# Patient Record
Sex: Male | Born: 1995 | Race: White | Hispanic: No | Marital: Single | Smoking: Never smoker
Health system: Southern US, Community
[De-identification: ages and names within clinical notes are randomized; demographics above are authoritative.]

## PROBLEM LIST (undated history)

## (undated) DIAGNOSIS — E109 Type 1 diabetes mellitus without complications: Secondary | ICD-10-CM

---
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
## 2015-09-25 ENCOUNTER — Emergency Department (HOSPITAL_BASED_OUTPATIENT_CLINIC_OR_DEPARTMENT_OTHER)
Admission: EM | Admit: 2015-09-25 | Discharge: 2015-09-25 | Payer: PRIVATE HEALTH INSURANCE | Attending: Emergency Medicine | Admitting: Emergency Medicine

## 2015-09-25 ENCOUNTER — Encounter (HOSPITAL_BASED_OUTPATIENT_CLINIC_OR_DEPARTMENT_OTHER): Payer: Self-pay | Admitting: *Deleted

## 2015-09-25 ENCOUNTER — Emergency Department (HOSPITAL_BASED_OUTPATIENT_CLINIC_OR_DEPARTMENT_OTHER): Payer: PRIVATE HEALTH INSURANCE

## 2015-09-25 DIAGNOSIS — Z794 Long term (current) use of insulin: Secondary | ICD-10-CM | POA: Diagnosis not present

## 2015-09-25 DIAGNOSIS — R1110 Vomiting, unspecified: Secondary | ICD-10-CM | POA: Diagnosis not present

## 2015-09-25 DIAGNOSIS — R109 Unspecified abdominal pain: Secondary | ICD-10-CM | POA: Diagnosis not present

## 2015-09-25 DIAGNOSIS — E109 Type 1 diabetes mellitus without complications: Secondary | ICD-10-CM | POA: Insufficient documentation

## 2015-09-25 DIAGNOSIS — L03311 Cellulitis of abdominal wall: Secondary | ICD-10-CM | POA: Diagnosis not present

## 2015-09-25 DIAGNOSIS — E1010 Type 1 diabetes mellitus with ketoacidosis without coma: Secondary | ICD-10-CM

## 2015-09-25 DIAGNOSIS — L0390 Cellulitis, unspecified: Secondary | ICD-10-CM

## 2015-09-25 HISTORY — DX: Type 1 diabetes mellitus without complications: E10.9

## 2015-09-25 LAB — COMPREHENSIVE METABOLIC PANEL
ALK PHOS: 185 U/L — AB (ref 38–126)
ALT: 20 U/L (ref 17–63)
AST: 24 U/L (ref 15–41)
Albumin: 5.8 g/dL — ABNORMAL HIGH (ref 3.5–5.0)
Anion gap: 29 — ABNORMAL HIGH (ref 5–15)
BUN: 18 mg/dL (ref 6–20)
CALCIUM: 9.5 mg/dL (ref 8.9–10.3)
CO2: 6 mmol/L — ABNORMAL LOW (ref 22–32)
Chloride: 98 mmol/L — ABNORMAL LOW (ref 101–111)
Creatinine, Ser: 1.48 mg/dL — ABNORMAL HIGH (ref 0.61–1.24)
GFR calc non Af Amer: >60 mL/min (ref >60)
Glucose, Bld: 630 mg/dL (ref 65–99)
Potassium: 6.4 mmol/L (ref 3.5–5.1)
Sodium: 133 mmol/L — ABNORMAL LOW (ref 135–145)
Total Bilirubin: 1.5 mg/dL — ABNORMAL HIGH (ref 0.3–1.2)
Total Protein: 9.6 g/dL — ABNORMAL HIGH (ref 6.5–8.1)

## 2015-09-25 LAB — I-STAT VENOUS BLOOD GAS, ED
ACID-BASE DEFICIT: 28 mmol/L — AB (ref 0.0–2.0)
BICARBONATE: 4.6 meq/L — AB (ref 20.0–24.0)
O2 SAT: 58 %
PH VEN: 6.875 — AB (ref 7.250–7.300)
PO2 VEN: 51 mmHg — AB (ref 30.0–45.0)
TCO2: 5 mmol/L (ref 0–100)
pCO2, Ven: 24.9 mmHg — ABNORMAL LOW (ref 45.0–50.0)

## 2015-09-25 LAB — I-STAT ARTERIAL BLOOD GAS, ED
Acid-base deficit: 26 mmol/L — ABNORMAL HIGH (ref 0.0–2.0)
BICARBONATE: 3.8 meq/L — AB (ref 20.0–24.0)
O2 SAT: 98 %
PCO2 ART: 15.2 mmHg — AB (ref 35.0–45.0)
PO2 ART: 153 mmHg — AB (ref 80.0–100.0)
pH, Arterial: 7.007 — CL (ref 7.350–7.450)

## 2015-09-25 LAB — URINALYSIS, ROUTINE W REFLEX MICROSCOPIC
BILIRUBIN URINE: NEGATIVE
Glucose, UA: >1000 mg/dL — AB
Leukocytes, UA: NEGATIVE
NITRITE: NEGATIVE
PH: 5 (ref 5.0–8.0)
Protein, ur: 100 mg/dL — AB
Specific Gravity, Urine: 1.026 (ref 1.005–1.030)

## 2015-09-25 LAB — CBC WITH DIFFERENTIAL/PLATELET
BAND NEUTROPHILS: 9 %
BASOS ABS: 0.5 10*3/uL — AB (ref 0.0–0.1)
BLASTS: 0 %
Basophils Relative: 1 %
Eosinophils Absolute: 0 10*3/uL (ref 0.0–0.7)
Eosinophils Relative: 0 %
HEMATOCRIT: 50.3 % (ref 39.0–52.0)
HEMOGLOBIN: 18.6 g/dL — AB (ref 13.0–17.0)
Lymphocytes Relative: 9 %
Lymphs Abs: 4.2 10*3/uL — ABNORMAL HIGH (ref 0.7–4.0)
MCH: 31.9 pg (ref 26.0–34.0)
MCHC: 37 g/dL — ABNORMAL HIGH (ref 30.0–36.0)
MCV: 86.3 fL (ref 78.0–100.0)
METAMYELOCYTES PCT: 1 %
Monocytes Absolute: 1.4 10*3/uL — ABNORMAL HIGH (ref 0.1–1.0)
Monocytes Relative: 3 %
Myelocytes: 1 %
Neutro Abs: 40.9 10*3/uL — ABNORMAL HIGH (ref 1.7–7.7)
Neutrophils Relative %: 76 %
Other: 0 %
PROMYELOCYTES ABS: 0 %
Platelets: 488 10*3/uL — ABNORMAL HIGH (ref 150–400)
RBC: 5.83 MIL/uL — AB (ref 4.22–5.81)
RDW: 13 % (ref 11.5–15.5)
WBC: 47 10*3/uL — ABNORMAL HIGH (ref 4.0–10.5)
nRBC: 0 /100 WBC

## 2015-09-25 LAB — CBG MONITORING, ED
GLUCOSE-CAPILLARY: 533 mg/dL — AB (ref 65–99)
GLUCOSE-CAPILLARY: 310 mg/dL — AB (ref 65–99)
GLUCOSE-CAPILLARY: 275 mg/dL — AB (ref 65–99)
Glucose-Capillary: 537 mg/dL — ABNORMAL HIGH (ref 65–99)
Glucose-Capillary: 329 mg/dL — ABNORMAL HIGH (ref 65–99)

## 2015-09-25 LAB — URINE MICROSCOPIC-ADD ON

## 2015-09-25 LAB — LIPASE, BLOOD: LIPASE: 14 U/L (ref 11–51)

## 2015-09-25 LAB — I-STAT CG4 LACTIC ACID, ED: Lactic Acid, Venous: 3.49 mmol/L (ref 0.5–2.0)

## 2015-09-25 MED ORDER — MORPHINE SULFATE (PF) 4 MG/ML IV SOLN
INTRAVENOUS | Status: AC
  Filled 2015-09-25: qty 1

## 2015-09-25 MED ORDER — ONDANSETRON HCL 4 MG/2ML IJ SOLN
4.0000 mg | Freq: Once | INTRAMUSCULAR | Status: AC
  Administered 2015-09-25: 4 mg via INTRAVENOUS
  Filled 2015-09-25: qty 2

## 2015-09-25 MED ORDER — DEXTROSE-NACL 5-0.45 % IV SOLN: INTRAVENOUS | Status: DC

## 2015-09-25 MED ORDER — SODIUM CHLORIDE 0.9 % IV BOLUS (SEPSIS): 1000.0000 mL | Freq: Once | INTRAVENOUS | Status: DC

## 2015-09-25 MED ORDER — CIPROFLOXACIN IN D5W 400 MG/200ML IV SOLN
400.0000 mg | Freq: Once | INTRAVENOUS | Status: AC
  Administered 2015-09-25: 400 mg via INTRAVENOUS
  Filled 2015-09-25: qty 200

## 2015-09-25 MED ORDER — SODIUM CHLORIDE 0.9 % IV SOLN
INTRAVENOUS | Status: DC
  Administered 2015-09-25: 4.7 [IU]/h via INTRAVENOUS
  Filled 2015-09-25 (×3): qty 2.5

## 2015-09-25 MED ORDER — SODIUM BICARBONATE 8.4 % IV SOLN
50.0000 meq | Freq: Once | INTRAVENOUS | Status: AC
  Administered 2015-09-25: 50 meq via INTRAVENOUS
  Filled 2015-09-25: qty 50

## 2015-09-25 MED ORDER — VANCOMYCIN HCL IN DEXTROSE 1-5 GM/200ML-% IV SOLN: 1000.0000 mg | Freq: Once | INTRAVENOUS | Status: DC

## 2015-09-25 MED ORDER — MORPHINE SULFATE (PF) 4 MG/ML IV SOLN
4.0000 mg | Freq: Once | INTRAVENOUS | Status: AC
  Administered 2015-09-25: 4 mg via INTRAVENOUS

## 2015-09-25 MED ORDER — SODIUM CHLORIDE 0.9 % IV BOLUS (SEPSIS)
1000.0000 mL | Freq: Once | INTRAVENOUS | Status: AC
  Administered 2015-09-25: 1000 mL via INTRAVENOUS

## 2015-09-25 MED ORDER — IOHEXOL 300 MG/ML  SOLN
100.0000 mL | Freq: Once | INTRAMUSCULAR | Status: AC | PRN
  Administered 2015-09-25: 100 mL via INTRAVENOUS

## 2015-09-25 MED ORDER — MORPHINE SULFATE (PF) 4 MG/ML IV SOLN
4.0000 mg | Freq: Once | INTRAVENOUS | Status: AC
  Administered 2015-09-25: 4 mg via INTRAVENOUS
  Filled 2015-09-25: qty 1

## 2015-09-25 MED ORDER — SODIUM CHLORIDE 0.9 % IV BOLUS (SEPSIS)
2000.0000 mL | Freq: Once | INTRAVENOUS | Status: AC
  Administered 2015-09-25: 2000 mL via INTRAVENOUS

## 2015-09-25 MED ORDER — METRONIDAZOLE IN NACL 5-0.79 MG/ML-% IV SOLN
500.0000 mg | Freq: Once | INTRAVENOUS | Status: AC
  Administered 2015-09-25: 500 mg via INTRAVENOUS
  Filled 2015-09-25: qty 100

## 2015-09-25 NOTE — ED Provider Notes (Signed)
CSN: [id number]     Arrival date & time 09/25/15  1013 History   First MD Initiated Contact with Patient 09/25/15 1017     Chief Complaint  Patient presents with  . Emesis     (Consider location/radiation/quality/duration/timing/severity/associated sxs/prior Treatment) The history is provided by the patient.  Andre Stanton is a 20 y.o. male history of type 1 diabetes here presenting with vomiting, abdominal pain. Patient has numerous episode of vomiting since 11 PM yesterday. Also complaints of diffuse abdominal pain worse in the epigastric area. Denies any fevers. Unable to keep anything down. Wonda Olds is sick with URI symptoms. Patient has hx of type 1 diabetes with DKA previously with similar symptoms. Has been followed with Dr. Yetta Barre from [facility]      Past Medical History  Diagnosis Date  . Diabetes mellitus type 1 (HCC)    History reviewed. No pertinent past surgical history. No family history on file. Social History  Substance Use Topics  . Smoking status: Never Smoker   . Smokeless tobacco: Never Used  . Alcohol Use: No    Review of Systems  Gastrointestinal: Positive for vomiting and abdominal pain.  All other systems reviewed and are negative.     Allergies  Review of patient's allergies indicates no known allergies.  Home Medications   Prior to Admission medications   Medication Sig Start Date End Date Taking? Authorizing Provider  insulin lispro (HUMALOG) 100 UNIT/ML injection Inject into the skin 3 (three) times daily before meals.   Yes Historical Provider, MD   BP 146/95 mmHg  Pulse 116  Temp(Src) 98.2 F (36.8 C) (Oral)  Resp 36  SpO2 100% Physical Exam  Constitutional: He is oriented to person, place, and time.  Uncomfortable, mild distress.   HENT:  Head: Normocephalic.  MM dry   Eyes: Conjunctivae are normal. Pupils are equal, round, and reactive to light.  Neck: Normal range of motion. Neck supple.  Cardiovascular: Regular rhythm and normal  heart sounds.   Tachycardic   Pulmonary/Chest: Effort normal and breath sounds normal.  tachypneic   Abdominal: Soft. Bowel sounds are normal.  Mild diffuse tenderness, worse in epigastrium.   Musculoskeletal: Normal range of motion. He exhibits no edema or tenderness.  Neurological: He is alert and oriented to person, place, and time. No cranial nerve deficit. Coordination normal.  Skin: Skin is warm and dry.  Psychiatric: He has a normal mood and affect. His behavior is normal. Judgment and thought content normal.  Nursing note and vitals reviewed.   ED Course  Procedures (including critical care time)  CRITICAL CARE Performed by: Silverio Lay, Mattie Nordell   Total critical care time: 30 minutes  Critical care time was exclusive of separately billable procedures and treating other patients.  Critical care was necessary to treat or prevent imminent or life-threatening deterioration.  Critical care was time spent personally by me on the following activities: development of treatment plan with patient and/or surrogate as well as nursing, discussions with consultants, evaluation of patient's response to treatment, examination of patient, obtaining history from patient or surrogate, ordering and performing treatments and interventions, ordering and review of laboratory studies, ordering and review of radiographic studies, pulse oximetry and re-evaluation of patient's condition.   Labs Review Labs Reviewed  CBC WITH DIFFERENTIAL/PLATELET - Abnormal; Notable for the following:    WBC 47.0 (*)    RBC 5.83 (*)    Hemoglobin 18.6 (*)    MCHC 37.0 (*)    Platelets 488 (*)  Neutro Abs 40.9 (*)    Lymphs Abs 4.2 (*)    Monocytes Absolute 1.4 (*)    Basophils Absolute 0.5 (*)    All other components within normal limits  COMPREHENSIVE METABOLIC PANEL - Abnormal; Notable for the following:    Sodium 133 (*)    Potassium 6.4 (*)    Chloride 98 (*)    CO2 6 (*)    Glucose, Bld 630 (*)     Creatinine, Ser 1.48 (*)    Total Protein 9.6 (*)    Albumin 5.8 (*)    Alkaline Phosphatase 185 (*)    Total Bilirubin 1.5 (*)    Anion gap 29 (*)    All other components within normal limits  URINALYSIS, ROUTINE W REFLEX MICROSCOPIC (NOT AT [facility]) - Abnormal; Notable for the following:    Glucose, UA >1000 (*)    Hgb urine dipstick SMALL (*)    Ketones, ur >80 (*)    Protein, ur 100 (*)    All other components within normal limits  URINE MICROSCOPIC-ADD ON - Abnormal; Notable for the following:    Squamous Epithelial / LPF 0-5 (*)    Bacteria, UA RARE (*)    Casts GRANULAR CAST (*)    All other components within normal limits  CBG MONITORING, ED - Abnormal; Notable for the following:    Glucose-Capillary 537 (*)    All other components within normal limits  I-STAT VENOUS BLOOD GAS, ED - Abnormal; Notable for the following:    pH, Ven 6.875 (*)    pCO2, Ven 24.9 (*)    pO2, Ven 51.0 (*)    Bicarbonate 4.6 (*)    Acid-base deficit 28.0 (*)    All other components within normal limits  CBG MONITORING, ED - Abnormal; Notable for the following:    Glucose-Capillary 533 (*)    All other components within normal limits  CBG MONITORING, ED - Abnormal; Notable for the following:    Glucose-Capillary 329 (*)    All other components within normal limits  I-STAT CG4 LACTIC ACID, ED - Abnormal; Notable for the following:    Lactic Acid, Venous 3.49 (*)    All other components within normal limits  I-STAT ARTERIAL BLOOD GAS, ED - Abnormal; Notable for the following:    pH, Arterial 7.007 (*)    pCO2 arterial 15.2 (*)    pO2, Arterial 153.0 (*)    Bicarbonate 3.8 (*)    Acid-base deficit 26.0 (*)    All other components within normal limits  CULTURE, BLOOD (ROUTINE X 2)  CULTURE, BLOOD (ROUTINE X 2)  LIPASE, BLOOD  BLOOD GAS, VENOUS  LACTIC ACID, PLASMA  BLOOD GAS, ARTERIAL    Imaging Review Ct Abdomen Pelvis W Contrast  09/25/2015  CLINICAL DATA:  Abdominal pain and vomiting  since last night EXAM: CT ABDOMEN AND PELVIS WITH CONTRAST TECHNIQUE: Multidetector CT imaging of the abdomen and pelvis was performed using the standard protocol following bolus administration of intravenous contrast. Sagittal and coronal MPR images reconstructed from axial data set. CONTRAST:  OMNIPAQUE IOHEXOL 300 MG/ML SOLN IV. Dilute oral contrast. COMPARISON:  None FINDINGS: Lung bases clear. Liver, gallbladder, spleen, pancreas, kidneys, and adrenal glands normal. Normal appearing retrocecal appendix. Normal appearing bladder and ureters. Stomach and bowel loops grossly unremarkable for technique. LEFT inguinal hernia containing fat. No intra-abdominal mass, adenopathy, free air or free fluid. Focal area of skin thickening and subcutaneous edema at the RIGHT mid abdomen centered at image 48,  could represent trauma or infection. No abdominal wall hernia or acute osseous findings. IMPRESSION: Focal area of skin thickening with subcutaneous edema at the RIGHT mid abdominal wall anteriorly which may represent infection/cellulitis or sequela of prior trauma; recommend correlation with physical exam and history. LEFT inguinal hernia containing fat. No acute intra-abdominal or intrapelvic abnormalities. Electronically Signed   By: Ulyses Southward M.D.   On: 09/25/2015 13:13   Dg Abd Acute W/chest  09/25/2015  CLINICAL DATA:  Abdominal pain and vomiting since last night. EXAM: DG ABDOMEN ACUTE W/ 1V CHEST COMPARISON:  10/14/2009 FINDINGS: Both lungs are clear. Heart and mediastinum are within normal limits. No evidence for free air. Air-fluid level in the stomach. Nonobstructive bowel gas pattern. No large abdominal calcifications. No acute bone abnormality. IMPRESSION: Negative abdominal radiographs.  No acute cardiopulmonary disease. Electronically Signed   By: Richarda Overlie M.D.   On: 09/25/2015 11:08   I have personally reviewed and evaluated these images and lab results as part of my medical  decision-making.   EKG Interpretation None      MDM   Final diagnoses:  None   Andre Stanton is a 20 y.o. male here with vomiting, abdominal pain. Likely DKA. Will need to get acute abdominal series to r/o perforation and get UA and VBG and labs. Will hydrate and start glucostabilizer.   11:40 AM WBC 47. PH 6.8 on VBG. Will get ABG and CT ab/pel and lactate. Code sepsis initiated.   1:36 PM CT showed possible cellulitis. I see some cellulitis from insulin needle injection site on R abdomen. I ordered cipro/flagyl for gastro symptoms but ct showed no colitis or enteritis. Will add vanc for MRSA. ABG showed pH 7.0, PCO2 15. Bicarb was 6 on chemistry. AG 28. K elevated but likely from acidosis so I didn't give him anything. Given 2 L NS and on third liter. Started on [employer]. Consulted MICU, Dr. Mady Haagensen at [facility]. He is breathing more comfortably now. Appears more hydrated now. Will not need intubation but needs ICU care. Will transfer when there is a bed there.     Richardean Canal, MD 09/25/15 [phone]

## 2015-09-25 NOTE — ED Notes (Signed)
[place] [facility] transport team at bedside for transport. 

## 2015-09-25 NOTE — ED Notes (Signed)
MD made aware of CBG value & at bedside.

## 2015-09-25 NOTE — ED Notes (Signed)
Vomiting since 11pm- multiole episodes. Pt is a type 1 diabetic. Has not taken insulin today. CBG 473 pta

## 2015-09-25 NOTE — ED Notes (Signed)
Patient is being transferred to Andre Stanton by [facility] to

## 2015-09-25 NOTE — ED Notes (Signed)
Report called to Brooke, RN

## 2015-09-30 LAB — CULTURE, BLOOD (ROUTINE X 2)
CULTURE: NO GROWTH
Culture: NO GROWTH

## 2016-05-21 ENCOUNTER — Encounter (HOSPITAL_BASED_OUTPATIENT_CLINIC_OR_DEPARTMENT_OTHER): Payer: Self-pay | Admitting: *Deleted

## 2016-05-21 ENCOUNTER — Emergency Department (HOSPITAL_BASED_OUTPATIENT_CLINIC_OR_DEPARTMENT_OTHER)
Admission: EM | Admit: 2016-05-21 | Discharge: 2016-05-22 | Disposition: A | Discharge status: Other Acute Inpatient Hospital | Payer: PRIVATE HEALTH INSURANCE | Attending: Emergency Medicine | Admitting: Emergency Medicine

## 2016-05-21 DIAGNOSIS — E1010 Type 1 diabetes mellitus with ketoacidosis without coma: Secondary | ICD-10-CM | POA: Diagnosis not present

## 2016-05-21 DIAGNOSIS — Z794 Long term (current) use of insulin: Secondary | ICD-10-CM | POA: Diagnosis not present

## 2016-05-21 DIAGNOSIS — R1110 Vomiting, unspecified: Secondary | ICD-10-CM | POA: Diagnosis present

## 2016-05-21 LAB — CBC
HEMATOCRIT: 48.4 % (ref 39.0–52.0)
HEMOGLOBIN: 17.8 g/dL — AB (ref 13.0–17.0)
MCH: 32 pg (ref 26.0–34.0)
MCHC: 36.8 g/dL — AB (ref 30.0–36.0)
MCV: 86.9 fL (ref 78.0–100.0)
Platelets: 320 10*3/uL (ref 150–400)
RBC: 5.57 MIL/uL (ref 4.22–5.81)
RDW: 12.6 % (ref 11.5–15.5)
WBC: 19.6 10*3/uL — ABNORMAL HIGH (ref 4.0–10.5)

## 2016-05-21 LAB — I-STAT VENOUS BLOOD GAS, ED
Acid-base deficit: 20 mmol/L — ABNORMAL HIGH (ref 0.0–2.0)
Bicarbonate: 8.1 mmol/L — ABNORMAL LOW (ref 20.0–28.0)
O2 SAT: 90 %
PCO2 VEN: 26.4 mmHg — AB (ref 44.0–60.0)
PH VEN: 7.091 — AB (ref 7.250–7.430)
PO2 VEN: 76 mmHg — AB (ref 32.0–45.0)
Patient temperature: 97.9
TCO2: 9 mmol/L (ref 0–100)

## 2016-05-21 LAB — BASIC METABOLIC PANEL
ANION GAP: 24 — AB (ref 5–15)
BUN: 20 mg/dL (ref 6–20)
CALCIUM: 9.7 mg/dL (ref 8.9–10.3)
CO2: 8 mmol/L — AB (ref 22–32)
Chloride: 101 mmol/L (ref 101–111)
Creatinine, Ser: 1.14 mg/dL (ref 0.61–1.24)
GFR calc Af Amer: >60 mL/min (ref >60)
GFR calc non Af Amer: >60 mL/min (ref >60)
GLUCOSE: 536 mg/dL — AB (ref 65–99)
Potassium: 5.2 mmol/L — ABNORMAL HIGH (ref 3.5–5.1)
Sodium: 133 mmol/L — ABNORMAL LOW (ref 135–145)

## 2016-05-21 LAB — CBG MONITORING, ED: Glucose-Capillary: 501 mg/dL (ref 65–99)

## 2016-05-21 MED ORDER — INSULIN REGULAR HUMAN 100 UNIT/ML IJ SOLN
INTRAMUSCULAR | Status: AC
  Filled 2016-05-21: qty 1

## 2016-05-21 MED ORDER — DEXTROSE-NACL 5-0.45 % IV SOLN: INTRAVENOUS | Status: DC

## 2016-05-21 MED ORDER — SODIUM CHLORIDE 0.9 % IV SOLN: INTRAVENOUS | Status: DC

## 2016-05-21 MED ORDER — SODIUM CHLORIDE 0.9 % IV BOLUS (SEPSIS)
1000.0000 mL | Freq: Once | INTRAVENOUS | Status: AC
  Administered 2016-05-22: 1000 mL via INTRAVENOUS

## 2016-05-21 MED ORDER — SODIUM CHLORIDE 0.9 % IV BOLUS (SEPSIS)
1000.0000 mL | Freq: Once | INTRAVENOUS | Status: AC
  Administered 2016-05-21: 1000 mL via INTRAVENOUS

## 2016-05-21 MED ORDER — SODIUM CHLORIDE 0.9 % IV SOLN
INTRAVENOUS | Status: DC
  Administered 2016-05-21: 4.8 [IU]/h via INTRAVENOUS
  Filled 2016-05-21: qty 2.5

## 2016-05-21 MED ORDER — ONDANSETRON HCL 4 MG/2ML IJ SOLN
4.0000 mg | Freq: Once | INTRAMUSCULAR | Status: AC
  Administered 2016-05-21: 4 mg via INTRAVENOUS
  Filled 2016-05-21: qty 2

## 2016-05-21 NOTE — ED Provider Notes (Signed)
[facility] Provider Note   CSN: [id number] Arrival date & time: 05/21/16  2202     History   Chief Complaint Chief Complaint  Patient presents with  . Emesis    HPI Andre Stanton is a 20 y.o. male.  HPI Patient has history of type 1 diabetes. Presents tonight with multiple episodes of vomiting starting around 6 PM. Complains of pain "all over". Denies any recent fever or chills. Has had similar presentations with DKA. States he believes this was going on this time. Denies non-compliance with his insulin regimen. Past Medical History:  Diagnosis Date  . Diabetes mellitus type 1 (HCC)     There are no active problems to display for this patient.   History reviewed. No pertinent surgical history.     Home Medications    Prior to Admission medications   Medication Sig Start Date End Date Taking? Authorizing Provider  Insulin Glargine (TOUJEO SOLOSTAR [place]) Inject into the skin.   Yes Historical Provider, MD  insulin lispro (HUMALOG) 100 UNIT/ML injection Inject into the skin 3 (three) times daily before meals.    Historical Provider, MD    Family History No family history on file.  Social History Social History  Substance Use Topics  . Smoking status: Never Smoker  . Smokeless tobacco: Never Used  . Alcohol use No     Allergies   Review of patient's allergies indicates no known allergies.   Review of Systems Review of Systems  Constitutional: Positive for fatigue. Negative for chills and fever.  Respiratory: Negative for cough and shortness of breath.   Cardiovascular: Negative for chest pain.  Gastrointestinal: Positive for abdominal pain, nausea and vomiting. Negative for constipation and diarrhea.  Genitourinary: Negative for dysuria and flank pain.  Musculoskeletal: Positive for myalgias. Negative for joint swelling, neck pain and neck stiffness.  Skin: Negative for rash and wound.  Neurological: Positive for weakness (generalized) and  headaches. Negative for dizziness, light-headedness and numbness.  All other systems reviewed and are negative.    Physical Exam Updated Vital Signs BP 124/74   Pulse 108   Temp 97.9 F (36.6 C) (Oral)   Resp 18   Ht 5\' 4"  (1.626 m)   Wt 120 lb (54.4 kg)   SpO2 100%   BMI 20.60 kg/m   Physical Exam  Constitutional: He is oriented to person, place, and time. He appears well-developed and well-nourished. No distress.  HENT:  Head: Normocephalic and atraumatic.  Mouth/Throat: Oropharynx is clear and moist.  Eyes: EOM are normal. Pupils are equal, round, and reactive to light.  Neck: Normal range of motion. Neck supple.  Cardiovascular: Regular rhythm.  Exam reveals no friction rub.   No murmur heard. Tachycardia  Pulmonary/Chest: Effort normal and breath sounds normal. No respiratory distress. He has no wheezes. He has no rales. He exhibits no tenderness.  Tachypnea  Abdominal: Soft. Bowel sounds are normal. There is tenderness (mild diffuse abdominal tenderness without focality.). There is no rebound and no guarding.  Musculoskeletal: Normal range of motion. He exhibits no edema or tenderness.  No lower extremity swelling or tenderness. Distal pulses intact.  Neurological: He is alert and oriented to person, place, and time.  Moves all extremities without deficit. Sensation is fully intact.  Skin: Skin is warm and dry. Capillary refill takes less than 2 seconds. No rash noted. No erythema.  Psychiatric: He has a normal mood and affect. His behavior is normal.  Nursing note and vitals reviewed.  ED Treatments / Results  Labs (all labs ordered are listed, but only abnormal results are displayed) Labs Reviewed  BASIC METABOLIC PANEL - Abnormal; Notable for the following:       Result Value   Sodium 133 (*)    Potassium 5.2 (*)    CO2 8 (*)    Glucose, Bld 536 (*)    Anion gap 24 (*)    All other components within normal limits  CBC - Abnormal; Notable for the  following:    WBC 19.6 (*)    Hemoglobin 17.8 (*)    MCHC 36.8 (*)    All other components within normal limits  CBG MONITORING, ED - Abnormal; Notable for the following:    Glucose-Capillary 501 (*)    All other components within normal limits  I-STAT VENOUS BLOOD GAS, ED - Abnormal; Notable for the following:    pH, Ven 7.091 (*)    pCO2, Ven 26.4 (*)    pO2, Ven 76.0 (*)    Bicarbonate 8.1 (*)    Acid-base deficit 20.0 (*)    All other components within normal limits  URINALYSIS, ROUTINE W REFLEX MICROSCOPIC (NOT AT [facility])  CBG MONITORING, ED    EKG  EKG Interpretation None       Radiology No results found.  Procedures Procedures (including critical care time)  Medications Ordered in ED Medications  insulin regular (NOVOLIN R,HUMULIN R) 250 Units in sodium chloride 0.9 % 250 mL (1 Units/mL) infusion (4.8 Units/hr Intravenous New Bag/Given 05/21/16 2317)  sodium chloride 0.9 % bolus 1,000 mL (not administered)    And  sodium chloride 0.9 % bolus 1,000 mL (1,000 mLs Intravenous New Bag/Given 05/21/16 2318)    And  sodium chloride 0.9 % bolus 1,000 mL (0 mLs Intravenous Stopped 05/21/16 2318)    And  0.9 %  sodium chloride infusion (not administered)  dextrose 5 %-0.45 % sodium chloride infusion (not administered)  insulin regular (NOVOLIN R,HUMULIN R) 100 units/mL injection (  Not Given 05/21/16 2323)  ondansetron (ZOFRAN) injection 4 mg (4 mg Intravenous Given 05/21/16 2235)     Initial Impression / Assessment and Plan / ED Course  I have reviewed the triage vital signs and the nursing notes.  Pertinent labs & imaging results that were available during my care of the patient were reviewed by me and considered in my medical decision making (see chart for details).  Clinical Course  CRITICAL CARE Performed by: Ranae PalmsYELVERTON, Narcissus Detwiler Total critical care time: 30 minutes Critical care time was exclusive of separately billable procedures and treating other  patients. Critical care was necessary to treat or prevent imminent or life-threatening deterioration. Critical care was time spent personally by me on the following activities: development of treatment plan with patient and/or surrogate as well as nursing, discussions with consultants, evaluation of patient's response to treatment, examination of patient, obtaining history from patient or surrogate, ordering and performing treatments and interventions, ordering and review of laboratory studies, ordering and review of radiographic studies, pulse oximetry and re-evaluation of patient's condition.  Started on DKA protocol and insulin drip. Abdominal exam is benign. Do not believe that imaging is necessary at this point. We'll discuss with hospitalist [facility].  Final Clinical Impressions(s) / ED Diagnoses   Final diagnoses:  Type 1 diabetes mellitus with ketoacidosis without coma (HCC)   Discussed with Dr. Lowell GuitarPowell. [facility]. New Prescriptions New Prescriptions   No medications on  file     Loren Racer, MD 05/22/16 [phone]

## 2016-05-21 NOTE — ED Notes (Signed)
Pt is vomiting at triage.

## 2016-05-21 NOTE — ED Triage Notes (Signed)
Vomiting since 6pm.  He feels dehydrated. Headache. States he feels like he is in DKA.

## 2016-05-22 DIAGNOSIS — E1010 Type 1 diabetes mellitus with ketoacidosis without coma: Secondary | ICD-10-CM | POA: Diagnosis not present

## 2016-05-22 DIAGNOSIS — Z794 Long term (current) use of insulin: Secondary | ICD-10-CM | POA: Diagnosis not present

## 2016-05-22 DIAGNOSIS — R1110 Vomiting, unspecified: Secondary | ICD-10-CM | POA: Diagnosis present

## 2016-05-22 LAB — CBG MONITORING, ED: Glucose-Capillary: 372 mg/dL — ABNORMAL HIGH (ref 65–99)

## 2016-05-22 NOTE — ED Notes (Signed)
Report called to care link 

## 2016-05-22 NOTE — ED Notes (Signed)
[facility]

## 2016-09-03 ENCOUNTER — Encounter (HOSPITAL_BASED_OUTPATIENT_CLINIC_OR_DEPARTMENT_OTHER): Payer: Self-pay

## 2016-09-03 ENCOUNTER — Inpatient Hospital Stay (HOSPITAL_BASED_OUTPATIENT_CLINIC_OR_DEPARTMENT_OTHER)
Admission: EM | Admit: 2016-09-03 | Discharge: 2016-09-06 | DRG: 639 | Disposition: A | Discharge status: Home / Self Care | Payer: PRIVATE HEALTH INSURANCE | Attending: Internal Medicine | Admitting: Internal Medicine

## 2016-09-03 DIAGNOSIS — E1010 Type 1 diabetes mellitus with ketoacidosis without coma: Secondary | ICD-10-CM

## 2016-09-03 DIAGNOSIS — E0810 Diabetes mellitus due to underlying condition with ketoacidosis without coma: Secondary | ICD-10-CM | POA: Diagnosis not present

## 2016-09-03 DIAGNOSIS — M25562 Pain in left knee: Secondary | ICD-10-CM | POA: Diagnosis present

## 2016-09-03 DIAGNOSIS — M25561 Pain in right knee: Secondary | ICD-10-CM | POA: Diagnosis present

## 2016-09-03 DIAGNOSIS — Z9119 Patient's noncompliance with other medical treatment and regimen: Secondary | ICD-10-CM | POA: Diagnosis not present

## 2016-09-03 DIAGNOSIS — R112 Nausea with vomiting, unspecified: Secondary | ICD-10-CM | POA: Diagnosis not present

## 2016-09-03 DIAGNOSIS — Z794 Long term (current) use of insulin: Secondary | ICD-10-CM | POA: Diagnosis not present

## 2016-09-03 DIAGNOSIS — M2550 Pain in unspecified joint: Secondary | ICD-10-CM | POA: Diagnosis present

## 2016-09-03 DIAGNOSIS — E860 Dehydration: Secondary | ICD-10-CM | POA: Diagnosis present

## 2016-09-03 DIAGNOSIS — E876 Hypokalemia: Secondary | ICD-10-CM | POA: Diagnosis present

## 2016-09-03 DIAGNOSIS — M25522 Pain in left elbow: Secondary | ICD-10-CM | POA: Diagnosis present

## 2016-09-03 DIAGNOSIS — E1110 Type 2 diabetes mellitus with ketoacidosis without coma: Secondary | ICD-10-CM | POA: Diagnosis present

## 2016-09-03 DIAGNOSIS — M25521 Pain in right elbow: Secondary | ICD-10-CM | POA: Diagnosis present

## 2016-09-03 LAB — BASIC METABOLIC PANEL
ANION GAP: 14 (ref 5–15)
BUN: 23 mg/dL — ABNORMAL HIGH (ref 6–20)
BUN: 12 mg/dL (ref 6–20)
CALCIUM: 9.4 mg/dL (ref 8.9–10.3)
CO2: 8 mmol/L — ABNORMAL LOW (ref 22–32)
CO2: 12 mmol/L — ABNORMAL LOW (ref 22–32)
Calcium: 8.7 mg/dL — ABNORMAL LOW (ref 8.9–10.3)
Chloride: 100 mmol/L — ABNORMAL LOW (ref 101–111)
Chloride: 107 mmol/L (ref 101–111)
Creatinine, Ser: 1.21 mg/dL (ref 0.61–1.24)
Creatinine, Ser: 0.9 mg/dL (ref 0.61–1.24)
GFR calc Af Amer: >60 mL/min (ref >60)
GFR calc non Af Amer: >60 mL/min (ref >60)
GLUCOSE: 593 mg/dL — AB (ref 65–99)
GLUCOSE: 180 mg/dL — AB (ref 65–99)
POTASSIUM: 4.5 mmol/L (ref 3.5–5.1)
POTASSIUM: 4.4 mmol/L (ref 3.5–5.1)
Sodium: 133 mmol/L — ABNORMAL LOW (ref 135–145)
Sodium: 133 mmol/L — ABNORMAL LOW (ref 135–145)

## 2016-09-03 LAB — CBC WITH DIFFERENTIAL/PLATELET
BAND NEUTROPHILS: 3 %
BASOS ABS: 0 10*3/uL (ref 0.0–0.1)
BLASTS: 0 %
Basophils Relative: 0 %
Eosinophils Absolute: 0 10*3/uL (ref 0.0–0.7)
Eosinophils Relative: 0 %
HEMATOCRIT: 47.4 % (ref 39.0–52.0)
Hemoglobin: 17.8 g/dL — ABNORMAL HIGH (ref 13.0–17.0)
LYMPHS PCT: 8 %
Lymphs Abs: 2.1 10*3/uL (ref 0.7–4.0)
MCH: 31.8 pg (ref 26.0–34.0)
MCHC: 37.6 g/dL — ABNORMAL HIGH (ref 30.0–36.0)
MCV: 84.8 fL (ref 78.0–100.0)
MYELOCYTES: 0 %
Metamyelocytes Relative: 0 %
Monocytes Absolute: 0.8 10*3/uL (ref 0.1–1.0)
Monocytes Relative: 3 %
NEUTROS ABS: 22.9 10*3/uL — AB (ref 1.7–7.7)
NEUTROS PCT: 86 %
PROMYELOCYTES ABS: 0 %
Platelets: 384 10*3/uL (ref 150–400)
RBC: 5.59 MIL/uL (ref 4.22–5.81)
RDW: 12.8 % (ref 11.5–15.5)
WBC: 25.8 10*3/uL — ABNORMAL HIGH (ref 4.0–10.5)
nRBC: 0 /100 WBC

## 2016-09-03 LAB — I-STAT VENOUS BLOOD GAS, ED
ACID-BASE DEFICIT: 21 mmol/L — AB (ref 0.0–2.0)
BICARBONATE: 8.4 mmol/L — AB (ref 20.0–28.0)
O2 Saturation: 69 %
PCO2 VEN: 29.1 mmHg — AB (ref 44.0–60.0)
PO2 VEN: 48 mmHg — AB (ref 32.0–45.0)
Patient temperature: 97.5
TCO2: 9 mmol/L (ref 0–100)
pH, Ven: 7.067 — CL (ref 7.250–7.430)

## 2016-09-03 LAB — URINALYSIS, ROUTINE W REFLEX MICROSCOPIC
Bilirubin Urine: NEGATIVE
Hgb urine dipstick: NEGATIVE
Ketones, ur: >80 mg/dL — AB
LEUKOCYTES UA: NEGATIVE
NITRITE: NEGATIVE
PH: 5.5 (ref 5.0–8.0)
Protein, ur: 30 mg/dL — AB
SPECIFIC GRAVITY, URINE: 1.035 — AB (ref 1.005–1.030)

## 2016-09-03 LAB — MRSA PCR SCREENING: MRSA BY PCR: NEGATIVE

## 2016-09-03 LAB — URINALYSIS, MICROSCOPIC (REFLEX)
RBC / HPF: NONE SEEN RBC/hpf (ref 0–5)
SQUAMOUS EPITHELIAL / LPF: NONE SEEN
WBC, UA: NONE SEEN WBC/hpf (ref 0–5)

## 2016-09-03 LAB — CBG MONITORING, ED
GLUCOSE-CAPILLARY: 581 mg/dL — AB (ref 65–99)
GLUCOSE-CAPILLARY: 542 mg/dL — AB (ref 65–99)
GLUCOSE-CAPILLARY: 306 mg/dL — AB (ref 65–99)
Glucose-Capillary: 194 mg/dL — ABNORMAL HIGH (ref 65–99)
Glucose-Capillary: 184 mg/dL — ABNORMAL HIGH (ref 65–99)

## 2016-09-03 LAB — GLUCOSE, CAPILLARY
GLUCOSE-CAPILLARY: 153 mg/dL — AB (ref 65–99)
Glucose-Capillary: 174 mg/dL — ABNORMAL HIGH (ref 65–99)
Glucose-Capillary: 171 mg/dL — ABNORMAL HIGH (ref 65–99)

## 2016-09-03 LAB — SEDIMENTATION RATE: SED RATE: 1 mm/h (ref 0–16)

## 2016-09-03 MED ORDER — DEXTROSE-NACL 5-0.45 % IV SOLN
INTRAVENOUS | Status: AC
  Administered 2016-09-04 (×3): via INTRAVENOUS

## 2016-09-03 MED ORDER — ONDANSETRON HCL 4 MG/2ML IJ SOLN
4.0000 mg | Freq: Once | INTRAMUSCULAR | Status: AC
  Administered 2016-09-03: 4 mg via INTRAVENOUS
  Filled 2016-09-03: qty 2

## 2016-09-03 MED ORDER — PNEUMOCOCCAL VAC POLYVALENT 25 MCG/0.5ML IJ INJ
0.5000 mL | INJECTION | INTRAMUSCULAR | Status: AC
  Administered 2016-09-05: 0.5 mL via INTRAMUSCULAR
  Filled 2016-09-03 (×2): qty 0.5

## 2016-09-03 MED ORDER — ENOXAPARIN SODIUM 40 MG/0.4ML ~~LOC~~ SOLN
40.0000 mg | SUBCUTANEOUS | Status: DC
  Administered 2016-09-03 – 2016-09-04 (×2): 40 mg via SUBCUTANEOUS
  Filled 2016-09-03: qty 0.4

## 2016-09-03 MED ORDER — SODIUM CHLORIDE 0.9 % IV SOLN: INTRAVENOUS | Status: DC

## 2016-09-03 MED ORDER — SODIUM CHLORIDE 0.9 % IV BOLUS (SEPSIS)
1000.0000 mL | Freq: Once | INTRAVENOUS | Status: AC
  Administered 2016-09-03: 1000 mL via INTRAVENOUS

## 2016-09-03 MED ORDER — DEXTROSE-NACL 5-0.45 % IV SOLN
INTRAVENOUS | Status: DC
  Administered 2016-09-03: 125 mL/h via INTRAVENOUS

## 2016-09-03 MED ORDER — SODIUM CHLORIDE 0.9 % IV SOLN
INTRAVENOUS | Status: DC
  Administered 2016-09-03: 4.8 [IU]/h via INTRAVENOUS
  Filled 2016-09-03 (×2): qty 2.5

## 2016-09-03 MED ORDER — ONDANSETRON HCL 4 MG PO TABS: 4.0000 mg | ORAL_TABLET | Freq: Four times a day (QID) | ORAL | Status: DC | PRN

## 2016-09-03 MED ORDER — ACETAMINOPHEN 650 MG RE SUPP: 650.0000 mg | Freq: Four times a day (QID) | RECTAL | Status: DC | PRN

## 2016-09-03 MED ORDER — SODIUM CHLORIDE 0.9 % IV BOLUS (SEPSIS)
1000.0000 mL | Freq: Once | INTRAVENOUS | Status: AC
Start: 2016-09-03 — End: 2016-09-03
  Administered 2016-09-03: 1000 mL via INTRAVENOUS

## 2016-09-03 MED ORDER — SODIUM CHLORIDE 0.9 % IV SOLN
INTRAVENOUS | Status: AC
  Filled 2016-09-03: qty 2.5

## 2016-09-03 MED ORDER — ACETAMINOPHEN 325 MG PO TABS: 650.0000 mg | ORAL_TABLET | Freq: Four times a day (QID) | ORAL | Status: DC | PRN

## 2016-09-03 MED ORDER — ONDANSETRON HCL 4 MG/2ML IJ SOLN: 4.0000 mg | Freq: Four times a day (QID) | INTRAMUSCULAR | Status: DC | PRN

## 2016-09-03 MED ORDER — METOCLOPRAMIDE HCL 5 MG/ML IJ SOLN
10.0000 mg | Freq: Once | INTRAMUSCULAR | Status: AC
  Administered 2016-09-03: 10 mg via INTRAVENOUS
  Filled 2016-09-03: qty 2

## 2016-09-03 MED ORDER — DEXTROSE 50 % IV SOLN: 25.0000 mL | INTRAVENOUS | Status: DC | PRN

## 2016-09-03 MED ORDER — INSULIN REGULAR BOLUS VIA INFUSION
0.0000 [IU] | Freq: Three times a day (TID) | INTRAVENOUS | Status: DC
  Filled 2016-09-03: qty 10

## 2016-09-03 NOTE — Progress Notes (Signed)
Patient presents with severe DKA, pH of 7.09, due  to noncompliance, accepted to stepdown, on insulin drip, will need more IV normal saline fluid boluses, on top of the 2 L he received already. Huey Bienenstockawood Xitlali Kastens MD

## 2016-09-03 NOTE — ED Provider Notes (Signed)
[facility] Provider Note   CSN: [id number] Arrival date & time: 09/03/16  1253     History   Chief Complaint Chief Complaint  Patient presents with  . Emesis    HPI Andre Stanton is a 21 y.o. male.  The history is provided by the patient and medical records.  Emesis      21 year old male with history of diabetes, presenting to the ED with nausea and vomiting which began this morning. He denies any associated fever or recent illness. States his blood sugar this morning was "high" on meter. States this feels like his prior episodes of DKA. Patient is currently on Humalog and Toujeo, states he has been taking as directed.  States episodes of DKA in the past have required hospitalization.  Past Medical History:  Diagnosis Date  . Diabetes mellitus type 1 (HCC)     There are no active problems to display for this patient.   History reviewed. No pertinent surgical history.     Home Medications    Prior to Admission medications   Medication Sig Start Date End Date Taking? Authorizing Provider  Insulin Glargine (TOUJEO SOLOSTAR [place]) Inject into the skin.    Historical Provider, MD  insulin lispro (HUMALOG) 100 UNIT/ML injection Inject into the skin 3 (three) times daily before meals.    Historical Provider, MD    Family History No family history on file.  Social History Social History  Substance Use Topics  . Smoking status: Never Smoker  . Smokeless tobacco: Never Used  . Alcohol use No     Allergies   Patient has no known allergies.   Review of Systems Review of Systems  Gastrointestinal: Positive for nausea and vomiting.  Endocrine:       Hyperglycemia  All other systems reviewed and are negative.    Physical Exam Updated Vital Signs BP 131/91 (BP Location: Left Arm)   Pulse 120   Temp 97.5 F (36.4 C) (Oral)   Resp 20   Ht 5\' 4"  (1.626 m)   Wt 54.4 kg   SpO2 98%   BMI 20.60 kg/m   Physical Exam  Constitutional: He is oriented  to person, place, and time. He appears well-developed and well-nourished. He has a sickly appearance. He appears ill.  Appears unwell, coughing into emesis bag during exam without active emesis  HENT:  Head: Normocephalic and atraumatic.  Mouth/Throat: Oropharynx is clear and moist.  Eyes: Conjunctivae and EOM are normal. Pupils are equal, round, and reactive to light.  Neck: Normal range of motion.  Cardiovascular: Regular rhythm and normal heart sounds.  Tachycardia present.   Pulmonary/Chest: Effort normal and breath sounds normal.  Abdominal: Soft. Bowel sounds are normal.  Musculoskeletal: Normal range of motion.  Neurological: He is alert and oriented to person, place, and time.  AAOx3, mentating normally, no focal deficits  Skin: Skin is warm and dry.  Psychiatric: He has a normal mood and affect.  Nursing note and vitals reviewed.    ED Treatments / Results  Labs (all labs ordered are listed, but only abnormal results are displayed) Labs Reviewed  CBC WITH DIFFERENTIAL/PLATELET - Abnormal; Notable for the following:       Result Value   WBC 25.8 (*)    Hemoglobin 17.8 (*)    MCHC 37.6 (*)    Neutro Abs 22.9 (*)    All other components within normal limits  BASIC METABOLIC PANEL - Abnormal; Notable for the following:    Sodium  133 (*)    Chloride 100 (*)    CO2 8 (*)    Glucose, Bld 593 (*)    BUN 23 (*)    Anion gap >20 (*)    All other components within normal limits  URINALYSIS, ROUTINE W REFLEX MICROSCOPIC - Abnormal; Notable for the following:    Specific Gravity, Urine 1.035 (*)    Glucose, UA >=500 (*)    Ketones, ur >80 (*)    Protein, ur 30 (*)    All other components within normal limits  URINALYSIS, MICROSCOPIC (REFLEX) - Abnormal; Notable for the following:    Bacteria, UA RARE (*)    All other components within normal limits  CBG MONITORING, ED - Abnormal; Notable for the following:    Glucose-Capillary 581 (*)    All other components within normal  limits  I-STAT VENOUS BLOOD GAS, ED - Abnormal; Notable for the following:    pH, Ven 7.067 (*)    pCO2, Ven 29.1 (*)    pO2, Ven 48.0 (*)    Bicarbonate 8.4 (*)    Acid-base deficit 21.0 (*)    All other components within normal limits  CBG MONITORING, ED - Abnormal; Notable for the following:    Glucose-Capillary 542 (*)    All other components within normal limits    EKG  EKG Interpretation None       Radiology No results found.  Procedures Procedures (including critical care time)  CRITICAL CARE Performed by: Garlon Hatchet   Total critical care time: 45 minutes  Critical care time was exclusive of separately billable procedures and treating other patients.  Critical care was necessary to treat or prevent imminent or life-threatening deterioration.  Critical care was time spent personally by me on the following activities: development of treatment plan with patient and/or surrogate as well as nursing, discussions with consultants, evaluation of patient's response to treatment, examination of patient, obtaining history from patient or surrogate, ordering and performing treatments and interventions, ordering and review of laboratory studies, ordering and review of radiographic studies, pulse oximetry and re-evaluation of patient's condition.   Medications Ordered in ED Medications  dextrose 5 %-0.45 % sodium chloride infusion (not administered)  insulin regular bolus via infusion 0-10 Units (not administered)  insulin regular (NOVOLIN R,HUMULIN R) 250 Units in sodium chloride 0.9 % 250 mL (1 Units/mL) infusion (4.8 Units/hr Intravenous New Bag/Given 09/03/16 1412)  dextrose 50 % solution 25 mL (not administered)  0.9 %  sodium chloride infusion (not administered)  sodium chloride 0.9 % bolus 1,000 mL (0 mLs Intravenous Stopped 09/03/16 1400)  ondansetron (ZOFRAN) injection 4 mg (4 mg Intravenous Given 09/03/16 1328)  sodium chloride 0.9 % bolus 1,000 mL (1,000 mLs  Intravenous New Bag/Given 09/03/16 1419)  metoCLOPramide (REGLAN) injection 10 mg (10 mg Intravenous Given 09/03/16 1402)     Initial Impression / Assessment and Plan / ED Course  I have reviewed the triage vital signs and the nursing notes.  Pertinent labs & imaging results that were available during my care of the patient were reviewed by me and considered in my medical decision making (see chart for details).  21 year old male here with nausea and vomiting. Reports glucose has been "high" this morning. CBG on arrival here is 581. Patient is coughing during exam but no active emesis. Reports he feels nauseated. He is tachycardic and appears unwell but is mentating normally.  Lab work consistent with DKA--  pH 7.067, bicarb low at 8, anion gap 25, >  80 ketones in urine.  Patient given IVFB x2, started on glucostabilizer.  [facility] under care of Dr. Randol KernElgergawy.  Temp admit orders placed.  EMTALA completed.  Final Clinical Impressions(s) / ED Diagnoses   Final diagnoses:  Diabetic ketoacidosis without coma associated with type 1 diabetes mellitus [facility](HCC)    New Prescriptions New Prescriptions   No medications on file     Garlon HatchetLisa M Kaylin Schellenberg, PA-C 09/03/16 1600    Vanetta MuldersScott Zackowski, MD 09/09/16 [phone]

## 2016-09-03 NOTE — H&P (Signed)
[facility] History and Physical  Andre Stanton KZS:[id number] DOB: 1996-05-18 DOA: 09/03/2016   PCP: Riccardo Dubin, PA-C. 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 male with a past medical history of type 1 diabetes diagnosed in 2015, who denies any other health problems whose last hospitalization for DKA was in October. Patient tells me that he was feeling well up until this morning when he woke up and started feeling nauseated. Started vomiting. He vomited more than 5 times today. Initially it was food. Subsequently it was fluids and with some specks of blood. He denies any fever. No diarrhea. No sick contacts recently. Denies any cough, shortness of breath. Denies any discomfort with urination. He does admit to joint pains involving his elbows and knees that he has noted for the past few weeks, but denies any swelling in these joints. Denies any falls or injuries. He denies noncompliance with his diabetic regimen. He last took his insulin last night. Denies any changes to his medication dose recently.  In the emergency department, patient was found to have severe DKA. He was started on an insulin infusion.  Home Medications: Prior to Admission medications   Medication Sig Start Date End Date Taking? Authorizing Provider  Insulin Glargine (TOUJEO SOLOSTAR [place]) Inject 31 Units into the skin at bedtime.    Yes Historical Provider, MD  insulin lispro (HUMALOG) 100 UNIT/ML injection Inject into the skin See admin instructions. 1 unit per 10 grams carbohydrates.  Inject at least 3 times daily with meals and as needed for high blood glucose as follows:   151-200=6 units 201-250=8 251-300=10 301-350=12, Etc....   Yes Historical Provider, MD    Allergies: No Known Allergies  Past Medical History: Past Medical History:  Diagnosis Date  . Diabetes mellitus type 1 ([place])     History reviewed.  No pertinent surgical history.  Social History: Patient lives with his girlfriend and her parents. He is currently unemployed. Denies smoking, alcohol use or illicit drug use. Usually independent with daily activities.  Family History:  Denies any health problems in his family  Review of Systems - History obtained from the patient General ROS: positive for  - fatigue Psychological ROS: negative Ophthalmic ROS: negative ENT ROS: negative Allergy and Immunology ROS: negative Hematological and Lymphatic ROS: negative Endocrine ROS: as in hpi Respiratory ROS: no cough, shortness of breath, or wheezing Cardiovascular ROS: no chest pain or dyspnea on exertion Gastrointestinal ROS: no abdominal pain, change in bowel habits, or black or bloody stools Genito-Urinary ROS: no dysuria, trouble voiding, or hematuria Musculoskeletal ROS: as in hpi Neurological ROS: no TIA or stroke symptoms Dermatological ROS: negative  Physical Examination  Vitals:   09/03/16 1600 09/03/16 1630 09/03/16 1700 09/03/16 1730  BP: 142/76 130/83 117/71 115/71  Pulse: 107 100 101 99  Resp: '14 22 22 14  '$ Temp:      TempSrc:      SpO2: 100% 100% 100% 100%  Weight:      Height:        BP 115/71   Pulse 99   Temp 97.5 F (36.4 C) (Oral)   Resp 14   Ht '5\' 4"'$  (1.626 m)   Wt 54.4 kg (120 lb)   SpO2 100%   BMI 20.60 kg/m   General appearance: alert, cooperative, appears stated age and no distress Head: Normocephalic, without obvious abnormality, atraumatic Eyes: conjunctivae/corneas clear. PERRL,  EOM's intact.  Throat: lips, mucosa, and tongue normal; teeth and gums normal and Dry mucous membranes Neck: no adenopathy, no carotid bruit, no JVD, supple, symmetrical, trachea midline and thyroid not enlarged, symmetric, no tenderness/mass/nodules Resp: clear to auscultation bilaterally Cardio: regular rate and rhythm, S1, S2 normal, no murmur, click, rub or gallop GI: soft, non-tender; bowel sounds normal; no  masses,  no organomegaly Extremities: No erythema or swelling noted over either of the elbow joints. No abnormality noted over the knee joints. Good range of motion of both the joints. Pulses: 2+ and symmetric Skin: Skin color, texture, turgor normal. No rashes or lesions Lymph nodes: Cervical, supraclavicular, and axillary nodes normal. Neurologic: Awake, alert. Oriented 3. No focal neurological deficits.   Labs on Admission: I have personally reviewed following labs and imaging studies  CBC:  Recent Labs Lab 09/03/16 1325  WBC 25.8*  NEUTROABS 22.9*  HGB 17.8*  HCT 47.4  MCV 84.8  PLT 283   Basic Metabolic Panel:  Recent Labs Lab 09/03/16 1325 09/03/16 2005  NA 133* 133*  K 4.5 4.4  CL 100* 107  CO2 8* 12*  GLUCOSE 593* 180*  BUN 23* 12  CREATININE 1.21 0.90  CALCIUM 9.4 8.7*   GFR: Estimated Creatinine Clearance: 100.7 mL/min (by C-G formula based on SCr of 0.9 mg/dL).  CBG:  Recent Labs Lab 09/03/16 1558 09/03/16 1705 09/03/16 1810 09/03/16 1922 09/03/16 2022  GLUCAP 306* 194* 184* 174* 171*   Urine analysis:    Component Value Date/Time   COLORURINE YELLOW 09/03/2016 1340   APPEARANCEUR CLEAR 09/03/2016 1340   LABSPEC 1.035 (H) 09/03/2016 1340   PHURINE 5.5 09/03/2016 1340   GLUCOSEU >=500 (A) 09/03/2016 1340   HGBUR NEGATIVE 09/03/2016 1340   BILIRUBINUR NEGATIVE 09/03/2016 1340   KETONESUR >80 (A) 09/03/2016 1340   PROTEINUR 30 (A) 09/03/2016 1340   NITRITE NEGATIVE 09/03/2016 1340   LEUKOCYTESUR NEGATIVE 09/03/2016 1340    Problem List  Principal Problem:   DKA (diabetic ketoacidoses) (HCC) Active Problems:   Arthralgia   Assessment: This is a 21 year old Caucasian male with past medical history as stated earlier, and which consists mainly of type 1 diabetes who presents with nausea, vomiting, and is found to be in diabetic ketoacidosis. No precipitant cause is identifiedfor his DKA. He denies noncompliance. He does not seem to have  any infectious process at this time. He is noted to have a significantly high WBC. However, his hemoglobin is also quite high and these both suggest hemoconcentration.  Plan: #1 Diabetic ketoacidosis in a patient with type 1 diabetes: As discussed above,no clear precipitant factor has been identified. Patient appears to have symptomatically improved. He will be continued on insulin infusion. Basic metabolic panels will be checked frequently. He will be given IV fluids. He will be transitioned to basal subcutaneous insulin once his anion gap has closed and he remains symptom free. Patient tells me that his last HbA1c was 7.2, which was in December. Another one will be checked during this hospitalization.   #2 Leukocytosis with elevated hemoglobin: Most likely secondary to hemoconcentration from dehydration. No obvious source of infection is noted. He is afebrile. Repeat labs tomorrow morning.  #3 Arthralgia: Complains of pain in his elbows and knees, ongoing for few weeks. Examination does not reveal any abnormalities. He denies any injuries. Check ESR. But ESR can be elevated due to his acute illness and may have to repeated as outpatient. He has been told that he needs to discuss these symptoms  with his outpatient providers.   DVT Prophylaxis: Lovenox Code Status: Full code Family Communication: Discussed with patient and his mother  Disposition Plan: Stepdown unit Consults called: None  Admission status: Inpatient status due to severity of illness, requiring close monitoring.   Further management decisions will depend on results of further testing and patient's response to treatment.   [facility]  [facility] Pager [phone]  If 7PM-7AM, please contact night-coverage [email or web address] Password [email or web address]  09/03/2016, 8:40 PM

## 2016-09-03 NOTE — Progress Notes (Signed)
Admitting MD paged at this time.

## 2016-09-03 NOTE — ED Triage Notes (Addendum)
Pt c/o vomiting since last night-states he is in "DKA"-has not checked BS since last night-was 115-pt gagging/dry heaving in triage

## 2016-09-03 NOTE — ED Notes (Signed)
Report called to susan at [facility]

## 2016-09-03 NOTE — ED Notes (Signed)
Pt on cardiac monitor. Family at bedside 

## 2016-09-04 DIAGNOSIS — E0810 Diabetes mellitus due to underlying condition with ketoacidosis without coma: Secondary | ICD-10-CM

## 2016-09-04 LAB — BASIC METABOLIC PANEL
ANION GAP: 13 (ref 5–15)
Anion gap: 14 (ref 5–15)
Anion gap: 9 (ref 5–15)
Anion gap: 9 (ref 5–15)
Anion gap: 5 (ref 5–15)
BUN: 9 mg/dL (ref 6–20)
BUN: 5 mg/dL — AB (ref 6–20)
BUN: 6 mg/dL (ref 6–20)
BUN: 7 mg/dL (ref 6–20)
BUN: 9 mg/dL (ref 6–20)
CALCIUM: 9 mg/dL (ref 8.9–10.3)
CALCIUM: 9.1 mg/dL (ref 8.9–10.3)
CHLORIDE: 109 mmol/L (ref 101–111)
CHLORIDE: 109 mmol/L (ref 101–111)
CHLORIDE: 108 mmol/L (ref 101–111)
CO2: 13 mmol/L — ABNORMAL LOW (ref 22–32)
CO2: 14 mmol/L — ABNORMAL LOW (ref 22–32)
CO2: 17 mmol/L — ABNORMAL LOW (ref 22–32)
CO2: 17 mmol/L — ABNORMAL LOW (ref 22–32)
CO2: 20 mmol/L — ABNORMAL LOW (ref 22–32)
Calcium: 8.8 mg/dL — ABNORMAL LOW (ref 8.9–10.3)
Calcium: 9 mg/dL (ref 8.9–10.3)
Calcium: 8.8 mg/dL — ABNORMAL LOW (ref 8.9–10.3)
Chloride: 106 mmol/L (ref 101–111)
Chloride: 110 mmol/L (ref 101–111)
Creatinine, Ser: 0.95 mg/dL (ref 0.61–1.24)
Creatinine, Ser: 0.86 mg/dL (ref 0.61–1.24)
Creatinine, Ser: 0.8 mg/dL (ref 0.61–1.24)
Creatinine, Ser: 0.65 mg/dL (ref 0.61–1.24)
Creatinine, Ser: 0.64 mg/dL (ref 0.61–1.24)
GFR calc Af Amer: >60 mL/min (ref >60)
GFR calc Af Amer: >60 mL/min (ref >60)
GFR calc Af Amer: >60 mL/min (ref >60)
GFR calc Af Amer: >60 mL/min (ref >60)
GFR calc non Af Amer: >60 mL/min (ref >60)
GFR calc non Af Amer: >60 mL/min (ref >60)
GFR calc non Af Amer: >60 mL/min (ref >60)
GLUCOSE: 201 mg/dL — AB (ref 65–99)
GLUCOSE: 183 mg/dL — AB (ref 65–99)
GLUCOSE: 156 mg/dL — AB (ref 65–99)
GLUCOSE: 139 mg/dL — AB (ref 65–99)
Glucose, Bld: 151 mg/dL — ABNORMAL HIGH (ref 65–99)
POTASSIUM: 3.7 mmol/L (ref 3.5–5.1)
POTASSIUM: 3.4 mmol/L — AB (ref 3.5–5.1)
POTASSIUM: 3.2 mmol/L — AB (ref 3.5–5.1)
Potassium: 4.1 mmol/L (ref 3.5–5.1)
Potassium: 3.7 mmol/L (ref 3.5–5.1)
SODIUM: 133 mmol/L — AB (ref 135–145)
Sodium: 135 mmol/L (ref 135–145)
Sodium: 135 mmol/L (ref 135–145)
Sodium: 135 mmol/L (ref 135–145)
Sodium: 135 mmol/L (ref 135–145)

## 2016-09-04 LAB — MAGNESIUM: MAGNESIUM: 1.7 mg/dL (ref 1.7–2.4)

## 2016-09-04 LAB — GLUCOSE, CAPILLARY
GLUCOSE-CAPILLARY: 125 mg/dL — AB (ref 65–99)
GLUCOSE-CAPILLARY: 136 mg/dL — AB (ref 65–99)
GLUCOSE-CAPILLARY: 139 mg/dL — AB (ref 65–99)
GLUCOSE-CAPILLARY: 152 mg/dL — AB (ref 65–99)
GLUCOSE-CAPILLARY: 158 mg/dL — AB (ref 65–99)
GLUCOSE-CAPILLARY: 182 mg/dL — AB (ref 65–99)
GLUCOSE-CAPILLARY: 192 mg/dL — AB (ref 65–99)
GLUCOSE-CAPILLARY: 194 mg/dL — AB (ref 65–99)
Glucose-Capillary: 155 mg/dL — ABNORMAL HIGH (ref 65–99)
Glucose-Capillary: 201 mg/dL — ABNORMAL HIGH (ref 65–99)
Glucose-Capillary: 197 mg/dL — ABNORMAL HIGH (ref 65–99)
Glucose-Capillary: 131 mg/dL — ABNORMAL HIGH (ref 65–99)
Glucose-Capillary: 126 mg/dL — ABNORMAL HIGH (ref 65–99)
Glucose-Capillary: 160 mg/dL — ABNORMAL HIGH (ref 65–99)
Glucose-Capillary: 191 mg/dL — ABNORMAL HIGH (ref 65–99)
Glucose-Capillary: 175 mg/dL — ABNORMAL HIGH (ref 65–99)
Glucose-Capillary: 166 mg/dL — ABNORMAL HIGH (ref 65–99)
Glucose-Capillary: 148 mg/dL — ABNORMAL HIGH (ref 65–99)
Glucose-Capillary: 131 mg/dL — ABNORMAL HIGH (ref 65–99)
Glucose-Capillary: 134 mg/dL — ABNORMAL HIGH (ref 65–99)
Glucose-Capillary: 337 mg/dL — ABNORMAL HIGH (ref 65–99)

## 2016-09-04 LAB — CBC
HCT: 43.5 % (ref 39.0–52.0)
Hemoglobin: 15.6 g/dL (ref 13.0–17.0)
MCH: 30.5 pg (ref 26.0–34.0)
MCHC: 35.9 g/dL (ref 30.0–36.0)
MCV: 85.1 fL (ref 78.0–100.0)
Platelets: 322 10*3/uL (ref 150–400)
RBC: 5.11 MIL/uL (ref 4.22–5.81)
RDW: 12.7 % (ref 11.5–15.5)
WBC: 14.9 10*3/uL — AB (ref 4.0–10.5)

## 2016-09-04 MED ORDER — SODIUM CHLORIDE 0.9 % IV SOLN
INTRAVENOUS | Status: DC
  Administered 2016-09-04: 19:00:00 via INTRAVENOUS

## 2016-09-04 MED ORDER — INSULIN ASPART 100 UNIT/ML ~~LOC~~ SOLN
0.0000 [IU] | Freq: Every day | SUBCUTANEOUS | Status: DC
  Administered 2016-09-04: 4 [IU] via SUBCUTANEOUS
  Administered 2016-09-05: 2 [IU] via SUBCUTANEOUS

## 2016-09-04 MED ORDER — INSULIN GLARGINE 100 UNIT/ML ~~LOC~~ SOLN
30.0000 [IU] | SUBCUTANEOUS | Status: DC
  Administered 2016-09-04 – 2016-09-05 (×2): 30 [IU] via SUBCUTANEOUS
  Filled 2016-09-04 (×3): qty 0.3

## 2016-09-04 MED ORDER — INSULIN ASPART 100 UNIT/ML ~~LOC~~ SOLN
0.0000 [IU] | Freq: Three times a day (TID) | SUBCUTANEOUS | Status: DC
  Administered 2016-09-05: 1 [IU] via SUBCUTANEOUS
  Administered 2016-09-05: 7 [IU] via SUBCUTANEOUS

## 2016-09-04 NOTE — Progress Notes (Signed)
PROGRESS NOTE  HODARI CHUBA ZOX:[id number] DOB: [birth year] DOA: 09/03/2016 PCP: Pola Corn, PA-C  HPI/Recap of past 24 hours:  Feeling better  Assessment/Plan: Principal Problem:   DKA (diabetic ketoacidoses) (HCC) Active Problems:   Arthralgia  DKA with type I diabetes Presented with nausea/dedydration Admitted to stepdown on insulin drip, gap closing, transition to subQ lantus around 5:40 pm, ssi Start carb diet, a1c pending  Leukocytosis: likely from dehydration and reactive, no infection identified  "CT ab/pel: Focal area of skin thickening with subcutaneous edema at the RIGHT mid abdominal wall anteriorly which may represent infection/cellulitis or sequela of prior trauma; recommend correlation with physical exam and history. LEFT inguinal hernia containing fat." On physical exam, ab nontender, there is no erythema Continue hydration, wbc trending down  N/V : CT Ab/epl no acute findings, likely due to DKA resolved, start diet after transition off insulin drip will check uds.  Code Status: full  Family Communication: patient   Disposition Plan: home , likely on 2/4   Consultants:  none  Procedures:  none  Antibiotics:  none   Objective: BP 119/80 (BP Location: Right Arm)   Pulse 83   Temp 98.3 F (36.8 C) (Oral)   Resp 17   Ht 5\' 4"  (1.626 m)   Wt 54.4 kg (120 lb)   SpO2 99%   BMI 20.60 kg/m   Intake/Output Summary (Last 24 hours) at 09/04/16 0859 Last data filed at 09/04/16 8119  Gross per 24 hour  Intake           2503.2 ml  Output             1250 ml  Net           1253.2 ml   Filed Weights   09/03/16 1307  Weight: 54.4 kg (120 lb)    Exam:   General:  NAD  Cardiovascular: RRR  Respiratory: CTABL  Abdomen: Soft/ND/NT, positive BS  Musculoskeletal: No Edema  Neuro: aaox3  Data Reviewed: Basic Metabolic Panel:  Recent Labs Lab 09/03/16 1325 09/03/16 2005 09/04/16 0032 09/04/16 0409  NA 133* 133* 135 133*    K 4.5 4.4 4.1 3.7  CL 100* 107 108 106  CO2 8* 12* 13* 14*  GLUCOSE 593* 180* 151* 201*  BUN 23* 12 9 9   CREATININE 1.21 0.90 0.95 0.86  CALCIUM 9.4 8.7* 9.0 9.1   Liver Function Tests: No results for input(s): AST, ALT, ALKPHOS, BILITOT, PROT, ALBUMIN in the last 168 hours. No results for input(s): LIPASE, AMYLASE in the last 168 hours. No results for input(s): AMMONIA in the last 168 hours. CBC:  Recent Labs Lab 09/03/16 1325 09/04/16 0409  WBC 25.8* 14.9*  NEUTROABS 22.9*  --   HGB 17.8* 15.6  HCT 47.4 43.5  MCV 84.8 85.1  PLT 384 322   Cardiac Enzymes:   No results for input(s): CKTOTAL, CKMB, CKMBINDEX, TROPONINI in the last 168 hours. BNP (last 3 results) No results for input(s): BNP in the last 8760 hours.  ProBNP (last 3 results) No results for input(s): PROBNP in the last 8760 hours.  CBG:  Recent Labs Lab 09/04/16 0345 09/04/16 0453 09/04/16 0558 09/04/16 0649 09/04/16 0749  GLUCAP 197* 182* 131* 126* 152*    Recent Results (from the past 240 hour(s))  MRSA PCR Screening     Status: None   Collection Time: 09/03/16  7:00 PM  Result Value Ref Range Status   MRSA by PCR NEGATIVE NEGATIVE Final  Comment:        The GeneXpert MRSA Assay (FDA approved for NASAL specimens only), is one component of a comprehensive MRSA colonization surveillance program. It is not intended to diagnose MRSA infection nor to guide or monitor treatment for MRSA infections.      Studies: No results found.  Scheduled Meds: . enoxaparin (LOVENOX) injection  40 mg Subcutaneous Q24H  . pneumococcal 23 valent vaccine  0.5 mL Intramuscular Tomorrow-1000    Continuous Infusions: . sodium chloride    . dextrose 5 % and 0.45% NaCl 125 mL/hr at 09/04/16 0655  . insulin (NOVOLIN-R) infusion 0.9 Units/hr (09/04/16 0802)     Time spent: 15mins  Aaniya Sterba MD, PhD  [facility] Pager [phone]. If 7PM-7AM, please contact night-coverage at [email or web address],  password [email or web address] 09/04/2016, 8:59 AM  LOS: 1 day

## 2016-09-05 DIAGNOSIS — E876 Hypokalemia: Secondary | ICD-10-CM

## 2016-09-05 LAB — RAPID URINE DRUG SCREEN, HOSP PERFORMED
AMPHETAMINES: NOT DETECTED
Barbiturates: NOT DETECTED
Benzodiazepines: NOT DETECTED
Cocaine: NOT DETECTED
OPIATES: NOT DETECTED
Tetrahydrocannabinol: POSITIVE — AB

## 2016-09-05 LAB — BASIC METABOLIC PANEL
ANION GAP: 8 (ref 5–15)
BUN: 12 mg/dL (ref 6–20)
CALCIUM: 8.4 mg/dL — AB (ref 8.9–10.3)
CO2: 22 mmol/L (ref 22–32)
Chloride: 108 mmol/L (ref 101–111)
Creatinine, Ser: 0.68 mg/dL (ref 0.61–1.24)
GFR calc non Af Amer: >60 mL/min (ref >60)
Glucose, Bld: 184 mg/dL — ABNORMAL HIGH (ref 65–99)
Potassium: 2.9 mmol/L — ABNORMAL LOW (ref 3.5–5.1)
SODIUM: 138 mmol/L (ref 135–145)

## 2016-09-05 LAB — HEMOGLOBIN A1C
Hgb A1c MFr Bld: 9.6 % — ABNORMAL HIGH (ref 4.8–5.6)
MEAN PLASMA GLUCOSE: 229 mg/dL

## 2016-09-05 LAB — CBC
HEMATOCRIT: 35.8 % — AB (ref 39.0–52.0)
HEMOGLOBIN: 13.3 g/dL (ref 13.0–17.0)
MCH: 30.7 pg (ref 26.0–34.0)
MCHC: 37 g/dL — ABNORMAL HIGH (ref 30.0–36.0)
MCV: 82.7 fL (ref 78.0–100.0)
Platelets: 202 10*3/uL (ref 150–400)
RBC: 4.33 MIL/uL (ref 4.22–5.81)
RDW: 12.4 % (ref 11.5–15.5)
WBC: 6.6 10*3/uL (ref 4.0–10.5)

## 2016-09-05 LAB — GLUCOSE, CAPILLARY
GLUCOSE-CAPILLARY: 117 mg/dL — AB (ref 65–99)
GLUCOSE-CAPILLARY: 348 mg/dL — AB (ref 65–99)
Glucose-Capillary: 146 mg/dL — ABNORMAL HIGH (ref 65–99)
Glucose-Capillary: 241 mg/dL — ABNORMAL HIGH (ref 65–99)

## 2016-09-05 LAB — MAGNESIUM
MAGNESIUM: 1.6 mg/dL — AB (ref 1.7–2.4)
MAGNESIUM: 1.7 mg/dL (ref 1.7–2.4)

## 2016-09-05 LAB — TSH: TSH: 0.85 u[IU]/mL (ref 0.350–4.500)

## 2016-09-05 MED ORDER — POTASSIUM CHLORIDE IN NACL 40-0.9 MEQ/L-% IV SOLN
INTRAVENOUS | Status: DC
  Administered 2016-09-05: 75 mL/h via INTRAVENOUS
  Filled 2016-09-05: qty 1000

## 2016-09-05 MED ORDER — MAGNESIUM OXIDE 400 (241.3 MG) MG PO TABS
400.0000 mg | ORAL_TABLET | Freq: Two times a day (BID) | ORAL | Status: AC
  Administered 2016-09-05 – 2016-09-06 (×3): 400 mg via ORAL
  Filled 2016-09-05 (×3): qty 1

## 2016-09-05 MED ORDER — POTASSIUM CHLORIDE IN NACL 40-0.9 MEQ/L-% IV SOLN
INTRAVENOUS | Status: DC
  Administered 2016-09-06: 50 mL/h via INTRAVENOUS
  Filled 2016-09-05 (×2): qty 1000

## 2016-09-05 MED ORDER — POTASSIUM CHLORIDE CRYS ER 20 MEQ PO TBCR: 20.0000 meq | EXTENDED_RELEASE_TABLET | Freq: Two times a day (BID) | ORAL | Status: DC

## 2016-09-05 MED ORDER — POTASSIUM CHLORIDE CRYS ER 20 MEQ PO TBCR
20.0000 meq | EXTENDED_RELEASE_TABLET | Freq: Two times a day (BID) | ORAL | Status: DC
Start: 2016-09-05 — End: 2016-09-05
  Administered 2016-09-05: 20 meq via ORAL
  Filled 2016-09-05: qty 1

## 2016-09-05 MED ORDER — POTASSIUM CHLORIDE CRYS ER 20 MEQ PO TBCR
40.0000 meq | EXTENDED_RELEASE_TABLET | Freq: Two times a day (BID) | ORAL | Status: AC
  Administered 2016-09-05 – 2016-09-06 (×3): 40 meq via ORAL
  Filled 2016-09-05 (×3): qty 2

## 2016-09-05 NOTE — Progress Notes (Signed)
PROGRESS NOTE  Andre Stanton ZOX:[id number] DOB: 1996-05-04 DOA: 09/03/2016 PCP: Pola Corn, PA-C  Brief Narrative: This is a 21 year old Caucasian male with past medical history of type 1 diabetes who presented with nausea, vomiting and dehydration and was noted to be in diabetic ketoacidosis. No precipitant cause is identified for his DKA. He denies noncompliance. He did not seem to have any infectious process. His labs indicated leukocytosis with hemoconcentration.  Patient was admitted to the hospital and treated with IV insulin/fluids with monitoring of his electrolytes. His anion gap is closed and overall DKA resolved. Started oral intakes slowly overnight with improvement of his nausea.   Assessment/Plan: Principal Problem:   DKA (diabetic ketoacidoses) (HCC) Active Problems:   Arthralgia  1. Hypokalemia and hypomagnesemia: Replete and follow in the morning. Poss d/c in am  DM: DKA resolved. Continue insulin  Code Status: full  Family Communication: patient   Disposition Plan: home , likely on 2/4   Consultants:  none  Procedures:  none  Antibiotics:  none   Objective: BP 117/68 (BP Location: Right Arm)   Pulse 72   Temp 97.8 F (36.6 C) (Oral)   Resp 15   Ht 5\' 4"  (1.626 m)   Wt 54.4 kg (120 lb)   SpO2 98%   BMI 20.60 kg/m   Intake/Output Summary (Last 24 hours) at 09/05/16 1346 Last data filed at 09/05/16 0855  Gross per 24 hour  Intake          2073.34 ml  Output             1650 ml  Net           423.34 ml   Filed Weights   09/03/16 1307  Weight: 54.4 kg (120 lb)    Subjective:Feeling better, nausea improved, no vomiting, no abdominal pain, no fever or chills.  Exam:   General:  NAD  Cardiovascular: RRR  Respiratory: CTABL  Abdomen: Soft/ND/NT, positive BS  Musculoskeletal: No Edema  Neuro: aaox3  Data Reviewed: Basic Metabolic Panel:  Recent Labs Lab 09/04/16 0409 09/04/16 0909 09/04/16 1320 09/04/16 1647  09/05/16 0333  NA 133* 135 135 135 138  K 3.7 3.7 3.4* 3.2* 2.9*  CL 106 109 109 110 108  CO2 14* 17* 17* 20* 22  GLUCOSE 201* 183* 156* 139* 184*  BUN 9 7 6  5* 12  CREATININE 0.86 0.80 0.65 0.64 0.68  CALCIUM 9.1 8.8* 9.0 8.8* 8.4*  MG  --  1.7  --   --  1.6*   Liver Function Tests: No results for input(s): AST, ALT, ALKPHOS, BILITOT, PROT, ALBUMIN in the last 168 hours. No results for input(s): LIPASE, AMYLASE in the last 168 hours. No results for input(s): AMMONIA in the last 168 hours. CBC:  Recent Labs Lab 09/03/16 1325 09/04/16 0409 09/05/16 0333  WBC 25.8* 14.9* 6.6  NEUTROABS 22.9*  --   --   HGB 17.8* 15.6 13.3  HCT 47.4 43.5 35.8*  MCV 84.8 85.1 82.7  PLT 384 322 202   Cardiac Enzymes:   No results for input(s): CKTOTAL, CKMB, CKMBINDEX, TROPONINI in the last 168 hours. BNP (last 3 results) No results for input(s): BNP in the last 8760 hours.  ProBNP (last 3 results) No results for input(s): PROBNP in the last 8760 hours.  CBG:  Recent Labs Lab 09/04/16 1732 09/04/16 1830 09/04/16 2113 09/05/16 0758 09/05/16 1249  GLUCAP 134* 194* 337* 146* 117*    Recent Results (from the past  240 hour(s))  MRSA PCR Screening     Status: None   Collection Time: 09/03/16  7:00 PM  Result Value Ref Range Status   MRSA by PCR NEGATIVE NEGATIVE Final    Comment:        The GeneXpert MRSA Assay (FDA approved for NASAL specimens only), is one component of a comprehensive MRSA colonization surveillance program. It is not intended to diagnose MRSA infection nor to guide or monitor treatment for MRSA infections.      Studies: No results found.  Scheduled Meds: . enoxaparin (LOVENOX) injection  40 mg Subcutaneous Q24H  . insulin aspart  0-5 Units Subcutaneous QHS  . insulin aspart  0-9 Units Subcutaneous TID WC  . insulin glargine  30 Units Subcutaneous Q24H  . potassium chloride  20 mEq Oral BID    Continuous Infusions: . 0.9 % NaCl with KCl 40 mEq / L  75 mL/hr (09/05/16 1028)     Time spent: 15mins  OSEI-BONSU,Alena Blankenbeckler MD, PhD  [facility] Pager [phone]. If 7PM-7AM, please contact night-coverage at [email or web address], password [email or web address] 09/05/2016, 1:46 PM  LOS: 2 days

## 2016-09-06 DIAGNOSIS — E1010 Type 1 diabetes mellitus with ketoacidosis without coma: Principal | ICD-10-CM

## 2016-09-06 LAB — BASIC METABOLIC PANEL
ANION GAP: 8 (ref 5–15)
Anion gap: 7 (ref 5–15)
BUN: 12 mg/dL (ref 6–20)
BUN: 10 mg/dL (ref 6–20)
CHLORIDE: 106 mmol/L (ref 101–111)
CO2: 23 mmol/L (ref 22–32)
CO2: 25 mmol/L (ref 22–32)
CREATININE: 0.75 mg/dL (ref 0.61–1.24)
Calcium: 8.9 mg/dL (ref 8.9–10.3)
Calcium: 9.1 mg/dL (ref 8.9–10.3)
Chloride: 104 mmol/L (ref 101–111)
Creatinine, Ser: 0.89 mg/dL (ref 0.61–1.24)
GFR calc Af Amer: >60 mL/min (ref >60)
GFR calc Af Amer: >60 mL/min (ref >60)
GFR calc non Af Amer: >60 mL/min (ref >60)
GLUCOSE: 393 mg/dL — AB (ref 65–99)
GLUCOSE: 237 mg/dL — AB (ref 65–99)
POTASSIUM: 4.1 mmol/L (ref 3.5–5.1)
Potassium: 4.6 mmol/L (ref 3.5–5.1)
SODIUM: 134 mmol/L — AB (ref 135–145)
Sodium: 139 mmol/L (ref 135–145)

## 2016-09-06 LAB — GLUCOSE, CAPILLARY
GLUCOSE-CAPILLARY: 107 mg/dL — AB (ref 65–99)
GLUCOSE-CAPILLARY: 108 mg/dL — AB (ref 65–99)
Glucose-Capillary: 87 mg/dL (ref 65–99)

## 2016-09-06 LAB — HEMOGLOBIN A1C
Hgb A1c MFr Bld: 9.5 % — ABNORMAL HIGH (ref 4.8–5.6)
Mean Plasma Glucose: 226 mg/dL

## 2016-09-06 NOTE — Progress Notes (Signed)
Inpatient Diabetes Program Recommendations  AACE/ADA: New Consensus Statement on Inpatient Glycemic Control (2015)  Target Ranges:  Prepandial:   less than 140 mg/dL      Peak postprandial:   less than 180 mg/dL (1-2 hours)      Critically ill patients:  140 - 180 mg/dL   Spoke with patient about diabetes and home regimen for diabetes control. Patient reports that he is followed by [facility]. Patient reports that he is taking insulin as prescribed and that he last saw his Endocrinologist on 07/27/16. Then his A1c was 9.1% up from 8.3%. Patient reports transitioning to [place] once he runs out of [place]. Patient has a copay card from Endo to fill.  Patient reports that no changes were made with the doses of insulin at the last office visit. Patient states that he checks his glucose 3 times per day and that it is usually in the 100's mg/dl. I spoke with patient about his A1c level this admission (9.6% on 09/03/16) and that this average was higher than he indicates his glucose running.  Per his Endocrinologist patient was still drinking sugary drinks but eating low carb foods and not eating very much, Patient had missed one follow up appointment due to not feeling well. Patient was just diagnosed less than 3 years ago at the age of 21. Patient and Endocrinologist conversing about possible insulin pumps to try.  Discussed glucose and A1C goals. Discussed importance of checking CBGs and maintaining good CBG control to prevent long-term and short-term complications. Asked patient if there was another reason why his A1c would be elevated? Patient reports not knowing why its elevated. Asked patient if there was anything I could help with or if he had questions. Patient has no further questions at this time related to diabetes.  Thanks,  Christena DeemShannon Mekhi Lascola RN, MSN, [facility] [id number] Inpatient Diabetes Coordinator Team Pager [phone] (8a-5p)

## 2016-09-06 NOTE — Discharge Summary (Signed)
Physician Discharge Summary  Andre Stanton ZOX:[id number] DOB: May 09, 1996 DOA: 09/03/2016  PCP: Pola Corn, PA-C  Admit date: 09/03/2016 Discharge date: 09/06/2016  Admitted From: Home Disposition:  Home  Recommendations for Outpatient Follow-up:  1. Follow up with Endocrnology in 1-2 weeks  Home Health: No  Equipment/Devices: None   Discharge Condition:Stable  CODE STATUS: Full   Diet recommendation: Carb Modified   Brief/Interim Summary: This is a 21 year old Caucasian male with past medical history of type 1diabetes who presented with nausea, vomiting and dehydration and was noted to be in diabetic ketoacidosis. No precipitant causeis identified for his DKA. He denies noncompliance. He did not seem to have any infectious process. His labs indicated leukocytosis with hemoconcentration.  Patient was admitted to the hospital and treated with IV insulin/fluids with monitoring of his electrolytes. His anion gap is closed and overall DKA resolved.  Discharge Diagnoses:  Principal Problem:   DKA (diabetic ketoacidoses) (HCC) Active Problems:   Arthralgia  Diabetic ketoacidosis in Type 1 DM- ?precipating etiology. Complaint with meds per pt. No fever, or symptoms to suggest infection. WBC-  Venous blood gas- Ph- 7.091, AG- 25, Co2- 8, blood glucose- 593. Hgba1c- 9.6. Pt was hydrated, placed on insulin infuision. Gap closed- was- 8 on discharge, WBC- 6.6, and CBG- 108 on discharge. Pt states insurance problems in getting insulin pump. Seen by Diabetes co-ordinator in pt. Pt encouraged to follow up with endocrinologist on discharge.   Discharge Instructions  Discharge Instructions    Diet - low sodium heart healthy    Complete by:  As directed    Discharge instructions    Complete by:  As directed    Please follow up with your endocrinologist. We have not made any adjustments to your insulin regimen, so continue what you were taking prior to admission.   Increase activity slowly     Complete by:  As directed      Allergies as of 09/06/2016   No Known Allergies     Medication List    TAKE these medications   insulin lispro 100 UNIT/ML injection Commonly known as:  HUMALOG Inject into the skin See admin instructions. 1 unit per 10 grams carbohydrates.  Inject at least 3 times daily with meals and as needed for high blood glucose as follows:   151-200=6 units 201-250=8 251-300=10 301-350=12, Etc....   TOUJEO SOLOSTAR [place] Inject 31 Units into the skin at bedtime.      Follow-up Information    Endocrinologist. Schedule an appointment as soon as possible for a visit in 1 week(s).          No Known Allergies  Consultations: None  Procedures/Studies: None  Subjective: No complaints today. Pt is ready to go home. Tolerating Po without problems.   Discharge Exam: Vitals:   09/05/16 2022 09/06/16 0425  BP: 119/71 109/69  Pulse: 80 77  Resp: 16 16  Temp: 98.2 F (36.8 C) 98.3 F (36.8 C)   Vitals:   09/05/16 1331 09/05/16 1452 09/05/16 2022 09/06/16 0425  BP: 117/68 114/72 119/71 109/69  Pulse: 72 70 80 77  Resp: 15 18 16 16   Temp:  97.7 F (36.5 C) 98.2 F (36.8 C) 98.3 F (36.8 C)  TempSrc:  Oral Oral Oral  SpO2: 98% 100% 98% 99%  Weight:      Height:        General: Pt is alert, awake, not in acute distress Cardiovascular: RRR, S1/S2 +, no rubs, no gallops Respiratory: CTA bilaterally, no  wheezing, no rhonchi Abdominal: Soft, NT, ND, bowel sounds + Extremities: no edema, no cyanosis Neuro- moving all extremities.  The results of significant diagnostics from this hospitalization (including imaging, microbiology, ancillary and laboratory) are listed below for reference.     Microbiology: Recent Results (from the past 240 hour(s))  MRSA PCR Screening     Status: None   Collection Time: 09/03/16  7:00 PM  Result Value Ref Range Status   MRSA by PCR NEGATIVE NEGATIVE Final    Comment:        The GeneXpert MRSA Assay (FDA approved  for NASAL specimens only), is one component of a comprehensive MRSA colonization surveillance program. It is not intended to diagnose MRSA infection nor to guide or monitor treatment for MRSA infections.      Labs: BNP (last 3 results) No results for input(s): BNP in the last 8760 hours. Basic Metabolic Panel:  Recent Labs Lab 09/04/16 0909 09/04/16 1320 09/04/16 1647 09/05/16 0333 09/05/16 1659 09/06/16 0537  NA 135 135 135 138 134* 139  K 3.7 3.4* 3.2* 2.9* 4.6 4.1  CL 109 109 110 108 104 106  CO2 17* 17* 20* 22 23 25   GLUCOSE 183* 156* 139* 184* 393* 237*  BUN 7 6 5* 12 12 10   CREATININE 0.80 0.65 0.64 0.68 0.75 0.89  CALCIUM 8.8* 9.0 8.8* 8.4* 8.9 9.1  MG 1.7  --   --  1.6* 1.7  --    CBC:  Recent Labs Lab 09/03/16 1325 09/04/16 0409 09/05/16 0333  WBC 25.8* 14.9* 6.6  NEUTROABS 22.9*  --   --   HGB 17.8* 15.6 13.3  HCT 47.4 43.5 35.8*  MCV 84.8 85.1 82.7  PLT 384 322 202   CBG:  Recent Labs Lab 09/05/16 1709 09/05/16 2023 09/06/16 0336 09/06/16 0822 09/06/16 1239  GLUCAP 348* 241* 87 107* 108*   Hgb A1c  Recent Labs  09/03/16 2005 09/05/16 0333  HGBA1C 9.6* 9.5*   Thyroid function studies  Recent Labs  09/05/16 0333  TSH 0.850   Urinalysis    Component Value Date/Time   COLORURINE YELLOW 09/03/2016 1340   APPEARANCEUR CLEAR 09/03/2016 1340   LABSPEC 1.035 (H) 09/03/2016 1340   PHURINE 5.5 09/03/2016 1340   GLUCOSEU >=500 (A) 09/03/2016 1340   HGBUR NEGATIVE 09/03/2016 1340   BILIRUBINUR NEGATIVE 09/03/2016 1340   KETONESUR >80 (A) 09/03/2016 1340   PROTEINUR 30 (A) 09/03/2016 1340   NITRITE NEGATIVE 09/03/2016 1340   LEUKOCYTESUR NEGATIVE 09/03/2016 1340   Microbiology Recent Results (from the past 240 hour(s))  MRSA PCR Screening     Status: None   Collection Time: 09/03/16  7:00 PM  Result Value Ref Range Status   MRSA by PCR NEGATIVE NEGATIVE Final    Comment:        The GeneXpert MRSA Assay (FDA approved for  NASAL specimens only), is one component of a comprehensive MRSA colonization surveillance program. It is not intended to diagnose MRSA infection nor to guide or monitor treatment for MRSA infections.     Time coordinating discharge: Over 30 minutes  SIGNED:  Onnie BoerEjiroghene E Ayaansh Smail, MD  [facility] 09/06/2016, 2:41 PM Pager [phone]  If 7PM-7AM, please contact night-coverage [email or web address] Password [email or web address]

## 2016-09-06 NOTE — Progress Notes (Signed)
Nutrition Brief Note  Patient identified on the Malnutrition Screening Tool (MST) Report  Wt Readings from Last 15 Encounters:  09/03/16 120 lb (54.4 kg)  05/21/16 120 lb (54.4 kg)  06/11/14 125 lb (56.7 kg) (11 %, Z= -1.24)*   * Growth percentiles are based on CDC 2-20 Years data.    Body mass index is 20.6 kg/m. Patient meets criteria for normal based on current BMI.   Current diet order is CHO modified, patient is consuming approximately 75% of meals at this time. Labs and medications reviewed.   No nutrition interventions warranted at this time. If nutrition issues arise, please consult RD.   Tilda FrancoLindsey Kacper Cartlidge, MS, RD, LDN Pager: [phone] After Hours Pager: [phone]

## 2016-12-28 ENCOUNTER — Ambulatory Visit (INDEPENDENT_AMBULATORY_CARE_PROVIDER_SITE_OTHER): Payer: PRIVATE HEALTH INSURANCE | Admitting: Family Medicine

## 2016-12-28 ENCOUNTER — Encounter: Payer: Self-pay | Admitting: Family Medicine

## 2016-12-28 VITALS — BP 118/73 | HR 71 | Temp 97.9°F | Resp 16 | Ht 64.5 in | Wt 121.0 lb

## 2016-12-28 DIAGNOSIS — L02234 Carbuncle of groin: Secondary | ICD-10-CM

## 2016-12-28 MED ORDER — SULFAMETHOXAZOLE-TRIMETHOPRIM 800-160 MG PO TABS: 1.0000 | ORAL_TABLET | Freq: Two times a day (BID) | ORAL | 0 refills | Status: AC

## 2016-12-28 MED ORDER — CLINDAMYCIN PHOSPHATE 1 % EX GEL: Freq: Two times a day (BID) | CUTANEOUS | 0 refills | Status: DC

## 2016-12-28 NOTE — Patient Instructions (Addendum)
Antibiotic - You have received the antibiotic clindamycin.  It may take several days before you feel any benefit from this medicine. Be sure to take all the medication prescribed, even if you are feeling better before it is finished. Do not drink alcohol while taking antibiotics.  It is unknown whether you will develop an adverse reaction (diarrhea, rash, nausea or vomiting) or an allergic reaction (trouble breathing, anaphylaxis).        IF you received an x-ray today, you will receive an invoice from [facility]. Please contact [facility] at [phone] with questions or concerns regarding your invoice.   IF you received labwork today, you will receive an invoice from [facility]. Please contact [facility] at [phone] with questions or concerns regarding your invoice.   Our billing staff will not be able to assist you with questions regarding bills from these companies.  You will be contacted with the lab results as soon as they are available. The fastest way to get your results is to activate your My Chart account. Instructions are located on the last page of this paperwork. If you have not heard from [place] regarding the results in 2 weeks, please contact this office.      Skin Abscess A skin abscess is an infected area on or under your skin that contains a collection of pus and other material. An abscess may also be called a furuncle, carbuncle, or boil. An abscess can occur in or on almost any part of your body. Some abscesses break open (rupture) on their own. Most continue to get worse unless they are treated. The infection can spread deeper into the body and eventually into your blood, which can make you feel ill. Treatment usually involves draining the abscess. What are the causes? An abscess occurs when germs, often bacteria, pass through your skin and cause an infection. This may be caused by:  A scrape or cut on your skin.  A puncture wound through your skin,  including a needle injection.  Blocked oil or sweat glands.  Blocked and infected hair follicles.  A cyst that forms beneath your skin (sebaceous cyst) and becomes infected. What increases the risk? This condition is more likely to develop in people who:  Have a weak body defense system (immune system).  Have diabetes.  Have dry and irritated skin.  Get frequent injections or use illegal IV drugs.  Have a foreign body in a wound, such as a splinter.  Have problems with their lymph system or veins. What are the signs or symptoms? An abscess may start as a painful, firm bump under the skin. Over time, the abscess may get larger or become softer. Pus may appear at the top of the abscess, causing pressure and pain. It may eventually break through the skin and drain. Other symptoms include:  Redness.  Warmth.  Swelling.  Tenderness.  A sore on the skin. How is this diagnosed? This condition is diagnosed based on your medical history and a physical exam. A sample of pus may be taken from the abscess to find out what is causing the infection and what antibiotics can be used to treat it. You also may have:  Blood tests to look for signs of infection or spread of an infection to your blood.  Imaging studies such as ultrasound, CT scan, or MRI if the abscess is deep. How is this treated? Small abscesses that drain on their own may not need treatment. Treatment for an abscess that does not rupture  on its own may include:  Warm compresses applied to the area several times per day.  Incision and drainage. Your health care provider will make an incision to open the abscess and will remove pus and any foreign body or dead tissue. The incision area may be packed with gauze to keep it open for a few days while it heals.  Antibiotic medicines to treat infection. For a severe abscess, you may first get antibiotics through an IV and then change to oral antibiotics. Follow these  instructions at home: Abscess Care   If you have an abscess that has not drained, place a warm, clean, wet washcloth over the abscess several times a day. Do this as told by your health care provider.  Follow instructions from your health care provider about how to take care of your abscess. Make sure you:  Cover the abscess with a bandage (dressing).  Change your dressing or gauze as told by your health care provider.  Wash your hands with soap and water before you change the dressing or gauze. If soap and water are not available, use hand sanitizer.  Check your abscess every day for signs of a worsening infection. Check for:  More redness, swelling, or pain.  More fluid or blood.  Warmth.  More pus or a bad smell. Medicines   Take over-the-counter and prescription medicines only as told by your health care provider.  If you were prescribed an antibiotic medicine, take it as told by your health care provider. Do not stop taking the antibiotic even if you start to feel better. General instructions   To avoid spreading the infection:  Do not share personal care items, towels, or hot tubs with others.  Avoid making skin contact with other people.  Keep all follow-up visits as told by your health care provider. This is important. Contact a health care provider if:  You have more redness, swelling, or pain around your abscess.  You have more fluid or blood coming from your abscess.  Your abscess feels warm to the touch.  You have more pus or a bad smell coming from your abscess.  You have a fever.  You have muscle aches.  You have chills or a general ill feeling. Get help right away if:  You have severe pain.  You see red streaks on your skin spreading away from the abscess. This information is not intended to replace advice given to you by your health care provider. Make sure you discuss any questions you have with your health care provider. Document Released:  04/28/2005 Document Revised: 03/14/2016 Document Reviewed: 05/28/2015 Elsevier Interactive Patient Education  2017 [employer]

## 2016-12-28 NOTE — Progress Notes (Signed)
  Chief Complaint  Patient presents with  . Mass    l testes    HPI   Pt reports that he noticed a lump under his testicle for the past week He would rate his pain a 3/10 He denies night sweats fever chills He reports that he has not been picking at it   Past Medical History:  Diagnosis Date  . Diabetes mellitus type 1 (HCC)     Current Outpatient Prescriptions  Medication Sig Dispense Refill  . insulin degludec (TRESIBA FLEXTOUCH) 100 UNIT/ML SOPN FlexTouch Pen Inject 300 Units into the skin daily at 10 pm.    . insulin lispro (HUMALOG) 100 UNIT/ML injection Inject into the skin See admin instructions. 1 unit per 10 grams carbohydrates.  Inject at least 3 times daily with meals and as needed for high blood glucose as follows:   151-200=6 units 201-250=8 251-300=10 301-350=12, Etc....    [name]. clindamycin (CLINDAGEL) 1 % gel Apply topically 2 (two) times daily. 30 g 0  . Insulin Glargine (TOUJEO SOLOSTAR [place]) Inject 31 Units into the skin at bedtime.     . sulfamethoxazole-trimethoprim (BACTRIM DS,SEPTRA DS) 800-160 MG tablet Take 1 tablet by mouth 2 (two) times daily. 14 tablet 0   No current facility-administered medications for this visit.     Allergies: No Known Allergies  History reviewed. No pertinent surgical history.  Social History   Social History  . Marital status: Single    Spouse name: N/A  . Number of children: N/A  . Years of education: N/A   Social History Main Topics  . Smoking status: Never Smoker  . Smokeless tobacco: Never Used  . Alcohol use None  . Drug use: Yes    Types: Marijuana  . Sexual activity: Not Asked   Other Topics Concern  . None   Social History Narrative  . None    Review of Systems  Constitutional: Negative for chills and fever.  Neurological: Negative for dizziness and headaches.   See hpi  Objective: Vitals:   12/28/16 1716  BP: 118/73  Pulse: 71  Resp: 16  Temp: 97.9 F (36.6 C)  TempSrc: Oral  SpO2: 98%    Weight: 121 lb (54.9 kg)  Height: 5' 4.5" (1.638 m)    Physical Exam  Constitutional: He is oriented to person, place, and time. He appears well-developed and well-nourished.  HENT:  Head: Normocephalic and atraumatic.  Pulmonary/Chest: Effort normal.  Neurological: He is alert and oriented to person, place, and time.   Chaperone No LAD Testicular exam the skin on the left groin shows erythema and small area of fluctuance No pore  Assessment and Plan Andre Stanton was seen today for mass.  Diagnoses and all orders for this visit:  Carbuncle of groin- discussed skin care and antibiotics If no improvement then recommend follow up   -     clindamycin (CLINDAGEL) 1 % gel; Apply topically 2 (two) times daily. -     sulfamethoxazole-trimethoprim (BACTRIM DS,SEPTRA DS) 800-160 MG tablet; Take 1 tablet by mouth 2 (two) times daily.     Andre Stanton

## 2017-01-20 ENCOUNTER — Encounter: Payer: Self-pay | Admitting: Family Medicine

## 2017-01-20 ENCOUNTER — Ambulatory Visit (INDEPENDENT_AMBULATORY_CARE_PROVIDER_SITE_OTHER): Payer: PRIVATE HEALTH INSURANCE | Admitting: Family Medicine

## 2017-01-20 VITALS — BP 96/59 | HR 89 | Temp 98.3°F | Resp 16 | Ht 64.0 in | Wt 123.6 lb

## 2017-01-20 DIAGNOSIS — R195 Other fecal abnormalities: Secondary | ICD-10-CM

## 2017-01-20 DIAGNOSIS — F329 Major depressive disorder, single episode, unspecified: Secondary | ICD-10-CM | POA: Diagnosis not present

## 2017-01-20 DIAGNOSIS — R197 Diarrhea, unspecified: Secondary | ICD-10-CM | POA: Diagnosis not present

## 2017-01-20 DIAGNOSIS — F32A Depression, unspecified: Secondary | ICD-10-CM

## 2017-01-20 DIAGNOSIS — R1110 Vomiting, unspecified: Secondary | ICD-10-CM | POA: Diagnosis not present

## 2017-01-20 LAB — POCT CBC
Granulocyte percent: 62.9 %G (ref 37–80)
HCT, POC: 43.7 % (ref 43.5–53.7)
Hemoglobin: 15.7 g/dL (ref 14.1–18.1)
Lymph, poc: 1.8 (ref 0.6–3.4)
MCH, POC: 31.7 pg — AB (ref 27–31.2)
MCHC: 35.9 g/dL — AB (ref 31.8–35.4)
MCV: 88.3 fL (ref 80–97)
MID (cbc): 0.5 (ref 0–0.9)
MPV: 8.1 fL (ref 0–99.8)
POC Granulocyte: 3.8 (ref 2–6.9)
POC LYMPH PERCENT: 29.1 %L (ref 10–50)
POC MID %: 8 %M (ref 0–12)
Platelet Count, POC: 214 10*3/uL (ref 142–424)
RBC: 4.95 M/uL (ref 4.69–6.13)
RDW, POC: 12.7 %
WBC: 6.1 10*3/uL (ref 4.6–10.2)

## 2017-01-20 LAB — POC HEMOCCULT BLD/STL (OFFICE/1-CARD/DIAGNOSTIC): FECAL OCCULT BLD: NEGATIVE

## 2017-01-20 MED ORDER — ONDANSETRON 4 MG PO TBDP: 4.0000 mg | ORAL_TABLET | Freq: Three times a day (TID) | ORAL | 0 refills | Status: DC | PRN

## 2017-01-20 MED ORDER — SERTRALINE HCL 50 MG PO TABS: 50.0000 mg | ORAL_TABLET | Freq: Every day | ORAL | 1 refills | Status: DC

## 2017-01-20 NOTE — Progress Notes (Signed)
Patient ID: Andre Stanton, male    DOB: 07/08/1996  Age: 21 y.o. MRN: [id number]  Chief Complaint  Patient presents with  . Blood In Stools    per patient, but not sure started this morning  . Emesis    twice today  . Depression    positive answers in triage 27 points    Subjective:   21 year old man who is here with a history of having seen a red stool a couple times today. He has had some loose bowel movements today, 2 or 3 times. He also has had nausea and vomiting, with vomiting a couple days ago and today. He has some abdominal cramping no major pain. This is not a recurrent problem in him.  He is a type I diabetic, takes his medicines regularly for that.  On the depression screening he tested positive at 27 points. He says that he has had counseling through the school system at times over the years for many years. He has never seen anyone officially from the medical standpoint for depression though it is an ongoing and intermittent problem for him. At times in the past he has had suicidal ideation, nothing major now. 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 He started his education at [employer] in music, but now is planning to attend [employer] for business courses.  Current allergies, medications, problem list, past/family and social histories reviewed.  Objective:  BP (!) 96/59 (BP Location: Right Arm, Patient Position: Sitting, Cuff Size: Normal)   Pulse 89   Temp 98.3 F (36.8 C) (Oral)   Resp 16   Ht 5\' 4"  (1.626 m)   Wt 123 lb 9.6 oz (56.1 kg)   SpO2 98%   BMI 21.22 kg/m   Pleasant alert young man in no acute distress this time. Abdomen is soft without organomegaly, masses, or tenderness. Digital rectal exam was done and is normal with no anal lesions noted and normal-sized prostate. Stool for Hemoccult.  Assessment & Plan:   Assessment: 1. Red stool   2. Diarrhea, unspecified type   3. Vomiting,  intractability of vomiting not specified, presence of nausea not specified, unspecified vomiting type   4. Depression, unspecified depression type       Plan: Check CBC and Hemoccult card  Results for orders placed or performed in visit on 01/20/17  POCT CBC  Result Value Ref Range   WBC 6.1 4.6 - 10.2 K/uL   Lymph, poc 1.8 0.6 - 3.4   POC LYMPH PERCENT 29.1 10 - 50 %L   MID (cbc) 0.5 0 - 0.9   POC MID % 8.0 0 - 12 %M   POC Granulocyte 3.8 2 - 6.9   Granulocyte percent 62.9 37 - 80 %G   RBC 4.95 4.69 - 6.13 M/uL   Hemoglobin 15.7 14.1 - 18.1 g/dL   HCT, POC 40.943.7 81.143.5 - 53.7 %   MCV 88.3 80 - 97 fL   MCH, POC 31.7 (A) 27 - 31.2 pg   MCHC 35.9 (A) 31.8 - 35.4 g/dL   RDW, POC 91.412.7 %   Platelet Count, POC 214 142 - 424 K/uL   MPV 8.1 0 - 99.8 fL  POC Hemoccult Bld/Stl (1-Cd Office Dx)  Result Value Ref Range   Card #1 Date 01/20/17    Fecal Occult Blood, POC Negative Negative    It does not appear that he has any good evidence for rectal bleeding.  Treat symptomatically and return if needed. See instructions.     Patient Instructions   Referral will be made to a psychiatrist to evaluate you for possible medical treatment of your depression.  I did decide to go ahead and begin you on some treatment for depression with a medicine called sertraline. We will start at 50 mg 1 every morning. This needs to be taken faithfully on a daily basis, and it takes about 3 weeks before you note that you are feeling better. In the meanwhile we are trying to get you the appointment with psychiatrist.  If you see further red appearance in your stools, or if you keep having diarrhea or loose stools, return for further assessment. For right now I just recommend drinking plenty of fluids, avoid eating things that are obviously red such as peppers, tomatoes, some fruits and cherries, beets, etc.  If you have abdominal cramping take some Pepto-Bismol according to the instructions on the  bottle.  Take ondansetron (Zofran) 4 mg 1 orally or dissolved under tongue every 6-8 hours when needed for nausea or vomiting  Return at anytime if needed.    IF you received an x-ray today, you will receive an invoice from [facility]. Please contact [facility] at [phone] with questions or concerns regarding your invoice.   IF you received labwork today, you will receive an invoice from [place]. Please contact [facility] at [phone] with questions or concerns regarding your invoice.   Our billing staff will not be able to assist you with questions regarding bills from these companies.  You will be contacted with the lab results as soon as they are available. The fastest way to get your results is to activate your My Chart account. Instructions are located on the last page of this paperwork. If you have not heard from [place] regarding the results in 2 weeks, please contact this office.        Return if symptoms worsen or fail to improve.   Deette Revak, MD 01/20/2017

## 2017-01-20 NOTE — Progress Notes (Signed)
shay 

## 2017-01-20 NOTE — Patient Instructions (Addendum)
Referral will be made to a psychiatrist to evaluate you for possible medical treatment of your depression.  I did decide to go ahead and begin you on some treatment for depression with a medicine called sertraline. We will start at 50 mg 1 every morning. This needs to be taken faithfully on a daily basis, and it takes about 3 weeks before you note that you are feeling better. In the meanwhile we are trying to get you the appointment with psychiatrist.  If you see further red appearance in your stools, or if you keep having diarrhea or loose stools, return for further assessment. For right now I just recommend drinking plenty of fluids, avoid eating things that are obviously red such as peppers, tomatoes, some fruits and cherries, beets, etc.  If you have abdominal cramping take some Pepto-Bismol according to the instructions on the bottle.  Take ondansetron (Zofran) 4 mg 1 orally or dissolved under tongue every 6-8 hours when needed for nausea or vomiting  Return at anytime if needed.    IF you received an x-ray today, you will receive an invoice from [facility]. Please contact [facility] at [phone] with questions or concerns regarding your invoice.   IF you received labwork today, you will receive an invoice from [facility]. Please contact [facility] at [phone] with questions or concerns regarding your invoice.   Our billing staff will not be able to assist you with questions regarding bills from these companies.  You will be contacted with the lab results as soon as they are available. The fastest way to get your results is to activate your My Chart account. Instructions are located on the last page of this paperwork. If you have not heard from us regarding the results in 2 weeks, please contact this office.

## 2017-09-02 ENCOUNTER — Ambulatory Visit (INDEPENDENT_AMBULATORY_CARE_PROVIDER_SITE_OTHER): Payer: PRIVATE HEALTH INSURANCE | Admitting: Emergency Medicine

## 2017-09-02 ENCOUNTER — Emergency Department (HOSPITAL_COMMUNITY): Payer: PRIVATE HEALTH INSURANCE

## 2017-09-02 ENCOUNTER — Observation Stay (HOSPITAL_COMMUNITY)
Admission: EM | Admit: 2017-09-02 | Discharge: 2017-09-04 | Disposition: A | Discharge status: Home / Self Care | Payer: PRIVATE HEALTH INSURANCE | Attending: Internal Medicine | Admitting: Internal Medicine

## 2017-09-02 ENCOUNTER — Encounter (HOSPITAL_COMMUNITY): Payer: Self-pay

## 2017-09-02 ENCOUNTER — Other Ambulatory Visit: Payer: Self-pay

## 2017-09-02 ENCOUNTER — Encounter: Payer: Self-pay | Admitting: Emergency Medicine

## 2017-09-02 VITALS — BP 90/60 | HR 75 | Temp 98.6°F | Resp 16 | Ht 63.5 in | Wt 123.2 lb

## 2017-09-02 DIAGNOSIS — E1065 Type 1 diabetes mellitus with hyperglycemia: Secondary | ICD-10-CM | POA: Diagnosis not present

## 2017-09-02 DIAGNOSIS — Z820 Family history of epilepsy and other diseases of the nervous system: Secondary | ICD-10-CM | POA: Insufficient documentation

## 2017-09-02 DIAGNOSIS — R519 Headache, unspecified: Secondary | ICD-10-CM

## 2017-09-02 DIAGNOSIS — E1010 Type 1 diabetes mellitus with ketoacidosis without coma: Principal | ICD-10-CM | POA: Insufficient documentation

## 2017-09-02 DIAGNOSIS — Z79899 Other long term (current) drug therapy: Secondary | ICD-10-CM | POA: Diagnosis not present

## 2017-09-02 DIAGNOSIS — R739 Hyperglycemia, unspecified: Secondary | ICD-10-CM

## 2017-09-02 DIAGNOSIS — F1290 Cannabis use, unspecified, uncomplicated: Secondary | ICD-10-CM | POA: Insufficient documentation

## 2017-09-02 DIAGNOSIS — Z794 Long term (current) use of insulin: Secondary | ICD-10-CM | POA: Insufficient documentation

## 2017-09-02 DIAGNOSIS — M2550 Pain in unspecified joint: Secondary | ICD-10-CM | POA: Diagnosis not present

## 2017-09-02 DIAGNOSIS — IMO0001 Reserved for inherently not codable concepts without codable children: Secondary | ICD-10-CM

## 2017-09-02 DIAGNOSIS — R55 Syncope and collapse: Secondary | ICD-10-CM | POA: Insufficient documentation

## 2017-09-02 DIAGNOSIS — E119 Type 2 diabetes mellitus without complications: Secondary | ICD-10-CM

## 2017-09-02 DIAGNOSIS — R51 Headache: Secondary | ICD-10-CM

## 2017-09-02 LAB — RAPID URINE DRUG SCREEN, HOSP PERFORMED
Amphetamines: NOT DETECTED
Barbiturates: NOT DETECTED
Benzodiazepines: NOT DETECTED
Cocaine: NOT DETECTED
OPIATES: NOT DETECTED
TETRAHYDROCANNABINOL: POSITIVE — AB

## 2017-09-02 LAB — COMPREHENSIVE METABOLIC PANEL
ALBUMIN: 4.1 g/dL (ref 3.5–5.0)
ALK PHOS: 73 U/L (ref 38–126)
ALT: 11 U/L — ABNORMAL LOW (ref 17–63)
AST: 12 U/L — AB (ref 15–41)
Anion gap: 11 (ref 5–15)
BILIRUBIN TOTAL: 1 mg/dL (ref 0.3–1.2)
BUN: 14 mg/dL (ref 6–20)
CALCIUM: 9.2 mg/dL (ref 8.9–10.3)
CO2: 25 mmol/L (ref 22–32)
CREATININE: 0.7 mg/dL (ref 0.61–1.24)
Chloride: 100 mmol/L — ABNORMAL LOW (ref 101–111)
GFR calc Af Amer: >60 mL/min (ref >60)
GLUCOSE: 310 mg/dL — AB (ref 65–99)
POTASSIUM: 3.7 mmol/L (ref 3.5–5.1)
Sodium: 136 mmol/L (ref 135–145)
TOTAL PROTEIN: 7.3 g/dL (ref 6.5–8.1)

## 2017-09-02 LAB — URINALYSIS, ROUTINE W REFLEX MICROSCOPIC
BILIRUBIN URINE: NEGATIVE
Bacteria, UA: NONE SEEN
Glucose, UA: >=500 mg/dL — AB
Hgb urine dipstick: NEGATIVE
Ketones, ur: 80 mg/dL — AB
Leukocytes, UA: NEGATIVE
NITRITE: NEGATIVE
PROTEIN: NEGATIVE mg/dL
SPECIFIC GRAVITY, URINE: 1.041 — AB (ref 1.005–1.030)
Squamous Epithelial / LPF: NONE SEEN
pH: 6 (ref 5.0–8.0)

## 2017-09-02 LAB — CBG MONITORING, ED: Glucose-Capillary: 286 mg/dL — ABNORMAL HIGH (ref 65–99)

## 2017-09-02 MED ORDER — SODIUM CHLORIDE 0.9 % IV BOLUS (SEPSIS)
1000.0000 mL | Freq: Once | INTRAVENOUS | Status: AC
  Administered 2017-09-02: 1000 mL via INTRAVENOUS

## 2017-09-02 MED ORDER — INSULIN ASPART 100 UNIT/ML ~~LOC~~ SOLN
6.0000 [IU] | Freq: Once | SUBCUTANEOUS | Status: AC
  Administered 2017-09-02: 6 [IU] via SUBCUTANEOUS
  Filled 2017-09-02: qty 1

## 2017-09-02 MED ORDER — KETOROLAC TROMETHAMINE 30 MG/ML IJ SOLN
30.0000 mg | Freq: Once | INTRAMUSCULAR | Status: AC
  Administered 2017-09-02: 30 mg via INTRAVENOUS
  Filled 2017-09-02: qty 1

## 2017-09-02 NOTE — ED Notes (Signed)
It was reported that patient had been passing out several times today, he was seen at [facility] and sent here for further evaluation, also Pt is a diabetic, cbg per family was 290's.  Pt almost passed out while being walked into triage

## 2017-09-02 NOTE — ED Notes (Signed)
Pt reports that he has been having HA for months and has  Been passing opassed out of 3-4 times today. Pt is alert and oriented x 4 and verbally responsive.

## 2017-09-02 NOTE — Patient Instructions (Addendum)
  Go to ER now for further evaluation. Syncope Syncope is when you lose temporarily pass out (faint). Signs that you may be about to pass out include:  Feeling dizzy or light-headed.  Feeling sick to your stomach (nauseous).  Seeing all white or all black.  Having cold, clammy skin.  If you passed out, get help right away. Call your local emergency services (911 in the [place] Do not drive yourself to the hospital. Follow these instructions at home: Pay attention to any changes in your symptoms. Take these actions to help with your condition:  Have someone stay with you until you feel stable.  Do not drive, use machinery, or play sports until your doctor says it is okay.  Keep all follow-up visits as told by your doctor. This is important.  If you start to feel like you might pass out, lie down right away and raise (elevate) your feet above the level of your heart. Breathe deeply and steadily. Wait until all of the symptoms are gone.  Drink enough fluid to keep your pee (urine) clear or pale yellow.  If you are taking blood pressure or heart medicine, get up slowly and spend many minutes getting ready to sit and then stand. This can help with dizziness.  Take over-the-counter and prescription medicines only as told by your doctor.  Get help right away if:  You have a very bad headache.  You have unusual pain in your chest, tummy, or back.  You are bleeding from your mouth or rectum.  You have black or tarry poop (stool).  You have a very fast or uneven heartbeat (palpitations).  It hurts to breathe.  You pass out once or more than once.  You have jerky movements that you cannot control (seizure).  You are confused.  You have trouble walking.  You are very weak.  You have vision problems. These symptoms may be an emergency. Do not wait to see if the symptoms will go away. Get medical help right away. Call your local emergency services (911 in the [place] Do not  drive yourself to the hospital. This information is not intended to replace advice given to you by your health care provider. Make sure you discuss any questions you have with your health care provider. Document Released: 01/05/2008 Document Revised: 12/25/2015 Document Reviewed: 04/02/2015 Elsevier Interactive Patient Education  2018 [employer]    IF you received an x-ray today, you will receive an invoice from [facility]. Please contact [facility] at [phone] with questions or concerns regarding your invoice.   IF you received labwork today, you will receive an invoice from [facility]. Please contact [facility] at [phone] with questions or concerns regarding your invoice.   Our billing staff will not be able to assist you with questions regarding bills from these companies.  You will be contacted with the lab results as soon as they are available. The fastest way to get your results is to activate your My Chart account. Instructions are located on the last page of this paperwork. If you have not heard from us regarding the results in 2 weeks, please contact this office.

## 2017-09-02 NOTE — ED Triage Notes (Signed)
Patient c/o constant headache x 1 week. Patient denies any N/v or sensitivity to light. Patient states sound and the cold weather makes his head hurt more. Patient also reports slight dizziness.

## 2017-09-02 NOTE — ED Provider Notes (Signed)
[facility]-EMERGENCY DEPT Provider Note   CSN: [id number] Arrival date & time: 09/02/17  1806     History   Chief Complaint Chief Complaint  Patient presents with  . Headache    HPI Andre [facility] is a 22 y.o. male past medical history of type 1 diabetes who presents for intermittent syncopal episodes that have been ongoing for the last 3 days.  Girlfriend reports that patient has had several episodes of witnessed syncopal events over the last 2 days. She states that he had 3 episodes 3 days ago, followed by 1 episode yesterday and 3 episodes today. She states that patient will have a syncopal episode but denies any seizure or body shaking activity.  No tongue laceration or urinary incontinence noted.  Girlfriend states that patient will be out for a few seconds before coming to.  Patient denies any preceding chest pain or dizziness but states that he will feel lightheaded followed by a headache before syncopal episode.  Girlfriend states that today, patient had an unwitnessed syncopal episode in the shower.  Patient does not remember what happened but girlfrien states that when she found him, he was on the floor of the shower. Patient states that he is also been having intermittent headache that he describes as a 5 out of 10 throbbing sensation over the posterior aspect of his head that radiates around to the front.  No history of migraines.  He states that this is been ongoing issue for several months.  He has not had any evaluation.  He is intermittently taken ibuprofen with minimal improvement. Patient states that he will occasionally smoke marijuana which helps his headache. Patient states that he has been checking his blood sugars and they have been running in the 200s 300s. He has been compliant with his insulin.  Patient denies any fevers, chest pain, difficulty breathing, abdominal pain, nausea/vomiting, dysuria, hematuria  The history is provided by the patient.     Past Medical History:  Diagnosis Date  . Diabetes mellitus type 1 [facility])     Patient Active Problem List   Diagnosis Date Noted  . Syncope 09/02/2017  . Type 1 diabetes mellitus with hyperglycemia (HCC) 09/02/2017  . DKA (diabetic ketoacidoses) (HCC) 09/03/2016  . Arthralgia 09/03/2016    History reviewed. No pertinent surgical history.     Home Medications    Prior to Admission medications   Medication Sig Start Date End Date Taking? Authorizing Provider  Insulin Glargine (TOUJEO SOLOSTAR Elsie) Inject 31 Units into the skin at bedtime.    Yes [provider]  insulin lispro (HUMALOG) 100 UNIT/ML injection Inject into the skin See admin instructions. 1 unit per 10 grams carbohydrates.  Inject at least 3 times daily with meals and as needed for high blood glucose as follows:   151-200=6 units 201-250=8 251-300=10 301-350=12, Etc....   Yes [provider]  clindamycin (CLINDAGEL) 1 % gel Apply topically 2 (two) times daily. Patient not taking: Reported on 01/20/2017 12/28/16   Doristine Bosworth, MD  ondansetron (ZOFRAN ODT) 4 MG disintegrating tablet Take 1 tablet (4 mg total) by mouth every 8 (eight) hours as needed for nausea or vomiting. Patient not taking: Reported on 09/02/2017 01/20/17   Peyton Najjar, MD  sertraline (ZOLOFT) 50 MG tablet Take 1 tablet (50 mg total) by mouth daily. Patient not taking: Reported on 09/02/2017 01/20/17   Peyton Najjar, MD    Family History History reviewed. No pertinent family history.  Social  History Social History   Tobacco Use  . Smoking status: Never Smoker  . Smokeless tobacco: Never Used  Substance Use Topics  . Alcohol use: Yes    Comment: occasionally  . Drug use: Yes    Types: Marijuana    Comment: daily use     Allergies   Patient has no known allergies.   Review of Systems Review of Systems  Constitutional: Negative for chills and fever.  HENT: Negative for congestion.   Eyes: Negative for  visual disturbance.  Respiratory: Negative for cough and shortness of breath.   Cardiovascular: Negative for chest pain.  Gastrointestinal: Negative for abdominal pain, diarrhea, nausea and vomiting.  Genitourinary: Negative for dysuria and hematuria.  Musculoskeletal: Negative for back pain and neck pain.  Skin: Negative for rash.  Neurological: Positive for syncope and headaches. Negative for dizziness, weakness and numbness.     Physical Exam Updated Vital Signs BP 111/84   Pulse 61   Temp 98.1 F (36.7 C) (Oral)   Resp 13   SpO2 100%   Physical Exam  Constitutional: He is oriented to person, place, and time. He appears well-developed and well-nourished.  HENT:  Head: Normocephalic and atraumatic.  Mouth/Throat: Oropharynx is clear and moist and mucous membranes are normal.  No tenderness to palpation of skull. No deformities or crepitus noted. No open wounds, abrasions or lacerations.   Eyes: Conjunctivae, EOM and lids are normal. Pupils are equal, round, and reactive to light.  Neck: Full passive range of motion without pain.  Cardiovascular: Normal rate, regular rhythm, normal heart sounds and normal pulses. Exam reveals no gallop and no friction rub.  No murmur heard. Pulmonary/Chest: Effort normal and breath sounds normal.  Abdominal: Soft. Normal appearance. There is no tenderness. There is no rigidity and no guarding.  Musculoskeletal: Normal range of motion.  Neurological: He is alert and oriented to person, place, and time.  Cranial nerves III-XII intact Follows commands, Moves all extremities  5/5 strength to BUE and BLE  Sensation intact throughout all major nerve distributions Normal finger to nose. No dysdiadochokinesia. No pronator drift. No slurred speech. No facial droop.   Skin: Skin is warm and dry. Capillary refill takes less than 2 seconds.  Psychiatric: He has a normal mood and affect. His speech is normal.  Nursing note and vitals  reviewed.    ED Treatments / Results  Labs (all labs ordered are listed, but only abnormal results are displayed) Labs Reviewed  URINALYSIS, ROUTINE W REFLEX MICROSCOPIC - Abnormal; Notable for the following components:      Result Value   Specific Gravity, Urine 1.041 (*)    Glucose, UA >=500 (*)    Ketones, ur 80 (*)    All other components within normal limits  RAPID URINE DRUG SCREEN, HOSP PERFORMED - Abnormal; Notable for the following components:   Tetrahydrocannabinol POSITIVE (*)    All other components within normal limits  COMPREHENSIVE METABOLIC PANEL - Abnormal; Notable for the following components:   Chloride 100 (*)    Glucose, Bld 310 (*)    AST 12 (*)    ALT 11 (*)    All other components within normal limits  CBG MONITORING, ED - Abnormal; Notable for the following components:   Glucose-Capillary 286 (*)    All other components within normal limits  CBC WITH DIFFERENTIAL/PLATELET    EKG  EKG Interpretation  Date/Time:  Friday September 02 2017 23:14:39 EST Ventricular Rate:  69 PR Interval:  QRS Duration: 106 QT Interval:  414 QTC Calculation: 444 R Axis:   75 Text Interpretation:  Sinus rhythm Borderline short PR interval ST elev, probable normal early repol pattern Confirmed by Nicanor Alcon, April ([id number]) on 09/02/2017 11:17:31 PM       Radiology Ct Head Wo Contrast  Result Date: 09/02/2017 CLINICAL DATA:  Headache, syncope. EXAM: CT HEAD WITHOUT CONTRAST CT CERVICAL SPINE WITHOUT CONTRAST TECHNIQUE: Multidetector CT imaging of the head and cervical spine was performed following the standard protocol without intravenous contrast. Multiplanar CT image reconstructions of the cervical spine were also generated. COMPARISON:  None. FINDINGS: CT HEAD FINDINGS Brain: No evidence of acute infarction, hemorrhage, hydrocephalus, extra-axial collection or mass lesion/mass effect. Vascular: No hyperdense vessel or unexpected calcification. Skull: Normal. Negative for  fracture or focal lesion. Sinuses/Orbits: No acute finding. Other: None. CT CERVICAL SPINE FINDINGS Alignment: Normal. Skull base and vertebrae: No acute fracture. No primary bone lesion or focal pathologic process. Soft tissues and spinal canal: No prevertebral fluid or swelling. No visible canal hematoma. Disc levels:  Normal. Upper chest: Negative. Other: None. IMPRESSION: Normal head CT. Normal cervical spine. Electronically Signed   By: Lupita Raider, M.D.   On: 09/02/2017 23:12   Ct Cervical Spine Wo Contrast  Result Date: 09/02/2017 CLINICAL DATA:  Headache, syncope. EXAM: CT HEAD WITHOUT CONTRAST CT CERVICAL SPINE WITHOUT CONTRAST TECHNIQUE: Multidetector CT imaging of the head and cervical spine was performed following the standard protocol without intravenous contrast. Multiplanar CT image reconstructions of the cervical spine were also generated. COMPARISON:  None. FINDINGS: CT HEAD FINDINGS Brain: No evidence of acute infarction, hemorrhage, hydrocephalus, extra-axial collection or mass lesion/mass effect. Vascular: No hyperdense vessel or unexpected calcification. Skull: Normal. Negative for fracture or focal lesion. Sinuses/Orbits: No acute finding. Other: None. CT CERVICAL SPINE FINDINGS Alignment: Normal. Skull base and vertebrae: No acute fracture. No primary bone lesion or focal pathologic process. Soft tissues and spinal canal: No prevertebral fluid or swelling. No visible canal hematoma. Disc levels:  Normal. Upper chest: Negative. Other: None. IMPRESSION: Normal head CT. Normal cervical spine. Electronically Signed   By: Lupita Raider, M.D.   On: 09/02/2017 23:12    Procedures Procedures (including critical care time)  Medications Ordered in ED Medications  sodium chloride 0.9 % bolus 1,000 mL (0 mLs Intravenous Stopped 09/02/17 2320)  ketorolac (TORADOL) 30 MG/ML injection 30 mg (30 mg Intravenous Given 09/02/17 2349)  insulin aspart (novoLOG) injection 6 Units (6 Units  Subcutaneous Given 09/02/17 2349)     Initial Impression / Assessment and Plan / ED Course  I have reviewed the triage vital signs and the nursing notes.  Pertinent labs & imaging results that were available during my care of the patient were reviewed by me and considered in my medical decision making (see chart for details).  Clinical Course as of Sep 03 1634  Sat Sep 03, 2017  6045 Discussed with Dr. Toniann Fail who will admit.  [HM]    Clinical Course User Index [HM] Muthersbaugh, Dahlia Client, PA-C    22 y.o. male with past medical history of type 1 diabetes who presents for evaluation of nodes, persistent headache that has been ongoing for last several months.  No fevers, chest pain, dizziness, abdominal pain, nausea/vomiting.  No history of migraines.   Patient is afebrile, non-toxic appearing, sitting comfortably on examination table. Vital signs reviewed and stable. No neuro deficits noted on exam.  Neck is supple with full range of motion. Do not suspect meningitis.  Consider hypoglycemia versus electrolyte imbalance versus dehydration versus orthostatic hypotension vs arrhythmia. Do not suspect DKA at this time.  Given that patient had an unwitnessed fall and hit his head on the floor, will plan for CT imaging.  Plan to check basic labs.  POC glucose 266. CMP shows Glucose is 310.  Urine shows 80 ketones.  Urine drug screen positive for marijuana.  Orthostatic vital signs below. CT head and C spine negative for any acute abnormality. Plan to give insulin for hyperglycemia. Will also give analgesics for headache.   Orthostatic VS for the past 24 hrs:  BP- Lying Pulse- Lying BP- Sitting Pulse- Sitting BP- Standing at 0 minutes Pulse- Standing at 0 minutes  09/02/17 2202 111/69 67 109/73 69 111/75 75   Patient signed out to [employer]. Please see her note for further ED course.    Final Clinical Impressions(s) / ED Diagnoses   Final diagnoses:  Headache disorder  Syncope,  unspecified syncope type  IDDM (insulin dependent diabetes mellitus) [facility])  Hyperglycemia    ED Discharge Orders    None       Rosana Hoes 09/03/17 1637    Benjiman Core, MD 09/03/17 [phone]

## 2017-09-02 NOTE — Progress Notes (Signed)
Andre Stanton 21 y.o.   Chief Complaint  Patient presents with  . Headache    x 5 days  . Loss of Consciousness    5 minutes and per patient 5-6 episodes    HISTORY OF PRESENT ILLNESS: This is a 22 y.o. male diabetic on insulin complaining of recurrent syncopal episodes since the beginning of this week.  States he is passed at least 5 or 6 times for no apparent reason.  Denies fever, chills, or flulike symptoms.  Today's sugars have been running between 300- 400.  Girlfriend states the patient has been out for several minutes, no seizure activity, no postictal state.  Has been having a headache for 5 days.  Denies visual symptoms, nausea or vomiting, rectal bleeding, melena, or any other significant symptomatology.   HPI   Prior to Admission medications   Medication Sig Start Date End Date Taking? Authorizing Provider  insulin degludec (TRESIBA FLEXTOUCH) 100 UNIT/ML SOPN FlexTouch Pen Inject 300 Units into the skin daily at 10 pm.   Yes [provider]  Insulin Glargine (TOUJEO SOLOSTAR [place]) Inject 31 Units into the skin at bedtime.    Yes [provider]  insulin lispro (HUMALOG) 100 UNIT/ML injection Inject into the skin See admin instructions. 1 unit per 10 grams carbohydrates.  Inject at least 3 times daily with meals and as needed for high blood glucose as follows:   151-200=6 units 201-250=8 251-300=10 301-350=12, Etc....   Yes [provider]  ondansetron (ZOFRAN ODT) 4 MG disintegrating tablet Take 1 tablet (4 mg total) by mouth every 8 (eight) hours as needed for nausea or vomiting. 01/20/17  Yes Peyton Najjar, MD  clindamycin (CLINDAGEL) 1 % gel Apply topically 2 (two) times daily. Patient not taking: Reported on 01/20/2017 12/28/16   Doristine Bosworth, MD  sertraline (ZOLOFT) 50 MG tablet Take 1 tablet (50 mg total) by mouth daily. Patient not taking: Reported on 09/02/2017 01/20/17   Peyton Najjar, MD    No Known Allergies  Patient Active  Problem List   Diagnosis Date Noted  . DKA (diabetic ketoacidoses) (HCC) 09/03/2016  . Arthralgia 09/03/2016    Past Medical History:  Diagnosis Date  . Diabetes mellitus type 1 (HCC)     No past surgical history on file.  Social History   Socioeconomic History  . Marital status: Single    Spouse name: Not on file  . Number of children: Not on file  . Years of education: Not on file  . Highest education level: Not on file  Social Needs  . Financial resource strain: Not on file  . Food insecurity - worry: Not on file  . Food insecurity - inability: Not on file  . Transportation needs - medical: Not on file  . Transportation needs - non-medical: Not on file  Occupational History  . Not on file  Tobacco Use  . Smoking status: Never Smoker  . Smokeless tobacco: Never Used  Substance and Sexual Activity  . Alcohol use: Not on file  . Drug use: Yes    Types: Marijuana  . Sexual activity: Not on file  Other Topics Concern  . Not on file  Social History Narrative  . Not on file    No family history on file.   Review of Systems  Constitutional: Negative.  Negative for chills and fever.  HENT: Negative.  Negative for congestion and sore throat.   Eyes: Negative.  Negative for blurred vision and double vision.  Respiratory: Negative.  Negative for cough, hemoptysis and shortness of breath.   Cardiovascular: Negative.  Negative for chest pain, palpitations and leg swelling.  Gastrointestinal: Negative.  Negative for abdominal pain, diarrhea, nausea and vomiting.  Genitourinary: Negative.  Negative for dysuria and hematuria.  Musculoskeletal: Negative for joint pain and myalgias.  Skin: Negative.   Neurological: Positive for dizziness, loss of consciousness and headaches. Negative for seizures.  Endo/Heme/Allergies: Negative.   All other systems reviewed and are negative.   Vitals:   09/02/17 1704  BP: 90/60  Pulse: 75  Resp: 16  Temp: 98.6 F (37 C)  SpO2: 97%      Physical Exam  Constitutional: He is oriented to person, place, and time. He appears well-developed and well-nourished.  HENT:  Head: Normocephalic.  Right Ear: External ear normal.  Left Ear: External ear normal.  Nose: Nose normal.  Mouth/Throat: Oropharynx is clear and moist.  Eyes: EOM are normal. Pupils are equal, round, and reactive to light.  Neck: Normal range of motion. Neck supple. No JVD present.  Cardiovascular: Normal rate, regular rhythm and normal heart sounds.  Pulmonary/Chest: Effort normal and breath sounds normal.  Abdominal: Soft. Bowel sounds are normal. He exhibits no distension. There is no tenderness.  Musculoskeletal: Normal range of motion.  Lymphadenopathy:    He has no cervical adenopathy.  Neurological: He is alert and oriented to person, place, and time. He displays normal reflexes. No cranial nerve deficit or sensory deficit. He exhibits normal muscle tone. Coordination normal.  Skin: Skin is warm and dry. Capillary refill takes less than 2 seconds. No rash noted.  Psychiatric: He has a normal mood and affect. His behavior is normal.  Vitals reviewed.    ASSESSMENT & PLAN:   Patient Instructions    Go to ER now for further evaluation. Syncope Syncope is when you lose temporarily pass out (faint). Signs that you may be about to pass out include:  Feeling dizzy or light-headed.  Feeling sick to your stomach (nauseous).  Seeing all white or all black.  Having cold, clammy skin.  If you passed out, get help right away. Call your local emergency services (911 in the [place] Do not drive yourself to the hospital. Follow these instructions at home: Pay attention to any changes in your symptoms. Take these actions to help with your condition:  Have someone stay with you until you feel stable.  Do not drive, use machinery, or play sports until your doctor says it is okay.  Keep all follow-up visits as told by your doctor. This is  important.  If you start to feel like you might pass out, lie down right away and raise (elevate) your feet above the level of your heart. Breathe deeply and steadily. Wait until all of the symptoms are gone.  Drink enough fluid to keep your pee (urine) clear or pale yellow.  If you are taking blood pressure or heart medicine, get up slowly and spend many minutes getting ready to sit and then stand. This can help with dizziness.  Take over-the-counter and prescription medicines only as told by your doctor.  Get help right away if:  You have a very bad headache.  You have unusual pain in your chest, tummy, or back.  You are bleeding from your mouth or rectum.  You have black or tarry poop (stool).  You have a very fast or uneven heartbeat (palpitations).  It hurts to breathe.  You pass out once or more than once.  You have jerky movements that you cannot control (seizure).  You are confused.  You have trouble walking.  You are very weak.  You have vision problems. These symptoms may be an emergency. Do not wait to see if the symptoms will go away. Get medical help right away. Call your local emergency services (911 in the [place] Do not drive yourself to the hospital. This information is not intended to replace advice given to you by your health care provider. Make sure you discuss any questions you have with your health care provider. Document Released: 01/05/2008 Document Revised: 12/25/2015 Document Reviewed: 04/02/2015 Elsevier Interactive Patient Education  2018 [employer]    IF you received an x-ray today, you will receive an invoice from [facility]. Please contact [facility] at [phone] with questions or concerns regarding your invoice.   IF you received labwork today, you will receive an invoice from [facility]. Please contact [facility] at [phone] with questions or concerns regarding your invoice.   Our billing staff will not be able  to assist you with questions regarding bills from these companies.  You will be contacted with the lab results as soon as they are available. The fastest way to get your results is to activate your My Chart account. Instructions are located on the last page of this paperwork. If you have not heard from us regarding the results in 2 weeks, please contact this office.         Edwina BarthMiguel Tatum Massman, MD [facility]

## 2017-09-03 ENCOUNTER — Encounter (HOSPITAL_COMMUNITY): Payer: Self-pay | Admitting: Internal Medicine

## 2017-09-03 ENCOUNTER — Observation Stay (HOSPITAL_BASED_OUTPATIENT_CLINIC_OR_DEPARTMENT_OTHER): Payer: PRIVATE HEALTH INSURANCE

## 2017-09-03 DIAGNOSIS — R51 Headache: Secondary | ICD-10-CM

## 2017-09-03 DIAGNOSIS — R739 Hyperglycemia, unspecified: Secondary | ICD-10-CM

## 2017-09-03 DIAGNOSIS — E119 Type 2 diabetes mellitus without complications: Secondary | ICD-10-CM

## 2017-09-03 DIAGNOSIS — R55 Syncope and collapse: Secondary | ICD-10-CM

## 2017-09-03 DIAGNOSIS — Z794 Long term (current) use of insulin: Secondary | ICD-10-CM

## 2017-09-03 DIAGNOSIS — E1065 Type 1 diabetes mellitus with hyperglycemia: Secondary | ICD-10-CM | POA: Diagnosis not present

## 2017-09-03 LAB — GLUCOSE, CAPILLARY
GLUCOSE-CAPILLARY: 342 mg/dL — AB (ref 65–99)
GLUCOSE-CAPILLARY: 208 mg/dL — AB (ref 65–99)
Glucose-Capillary: 237 mg/dL — ABNORMAL HIGH (ref 65–99)
Glucose-Capillary: 169 mg/dL — ABNORMAL HIGH (ref 65–99)

## 2017-09-03 LAB — MAGNESIUM: Magnesium: 1.8 mg/dL (ref 1.7–2.4)

## 2017-09-03 LAB — ECHOCARDIOGRAM COMPLETE
Height: 63 in
WEIGHTICAEL: 1948.8 [oz_av]

## 2017-09-03 LAB — TROPONIN I

## 2017-09-03 LAB — TSH: TSH: 1.619 u[IU]/mL (ref 0.350–4.500)

## 2017-09-03 MED ORDER — ALUM & MAG HYDROXIDE-SIMETH 200-200-20 MG/5ML PO SUSP: 30.0000 mL | ORAL | Status: DC | PRN

## 2017-09-03 MED ORDER — INSULIN ASPART 100 UNIT/ML ~~LOC~~ SOLN
1.0000 [IU] | Freq: Three times a day (TID) | SUBCUTANEOUS | Status: DC
  Administered 2017-09-03: 3 [IU] via SUBCUTANEOUS
  Administered 2017-09-04: 5 [IU] via SUBCUTANEOUS

## 2017-09-03 MED ORDER — INSULIN ASPART 100 UNIT/ML ~~LOC~~ SOLN: 0.0000 [IU] | Freq: Every day | SUBCUTANEOUS | Status: DC

## 2017-09-03 MED ORDER — ENOXAPARIN SODIUM 40 MG/0.4ML ~~LOC~~ SOLN
40.0000 mg | SUBCUTANEOUS | Status: DC
  Filled 2017-09-03 (×2): qty 0.4

## 2017-09-03 MED ORDER — INSULIN ASPART 100 UNIT/ML ~~LOC~~ SOLN
0.0000 [IU] | Freq: Three times a day (TID) | SUBCUTANEOUS | Status: DC
  Administered 2017-09-03: 5 [IU] via SUBCUTANEOUS
  Administered 2017-09-03: 11 [IU] via SUBCUTANEOUS

## 2017-09-03 MED ORDER — ACETAMINOPHEN 650 MG RE SUPP: 650.0000 mg | Freq: Four times a day (QID) | RECTAL | Status: DC | PRN

## 2017-09-03 MED ORDER — SODIUM CHLORIDE 0.9 % IV BOLUS (SEPSIS)
2000.0000 mL | Freq: Once | INTRAVENOUS | Status: AC
  Administered 2017-09-03: 2000 mL via INTRAVENOUS

## 2017-09-03 MED ORDER — INSULIN GLARGINE 100 UNIT/ML ~~LOC~~ SOLN
31.0000 [IU] | Freq: Every day | SUBCUTANEOUS | Status: DC
  Administered 2017-09-03: 31 [IU] via SUBCUTANEOUS
  Filled 2017-09-03: qty 0.31

## 2017-09-03 MED ORDER — INSULIN GLARGINE 100 UNIT/ML ~~LOC~~ SOLN
37.0000 [IU] | Freq: Every day | SUBCUTANEOUS | Status: DC
  Filled 2017-09-03: qty 0.37

## 2017-09-03 MED ORDER — INSULIN GLARGINE 100 UNIT/ML ~~LOC~~ SOLN: 40.0000 [IU] | Freq: Every day | SUBCUTANEOUS | Status: DC

## 2017-09-03 MED ORDER — INSULIN GLARGINE 100 UNIT/ML ~~LOC~~ SOLN: 36.0000 [IU] | Freq: Every day | SUBCUTANEOUS | Status: DC

## 2017-09-03 MED ORDER — SODIUM CHLORIDE 0.9 % IV SOLN
INTRAVENOUS | Status: AC
  Administered 2017-09-03 (×3): via INTRAVENOUS

## 2017-09-03 MED ORDER — INSULIN ASPART 100 UNIT/ML ~~LOC~~ SOLN
0.0000 [IU] | Freq: Every day | SUBCUTANEOUS | Status: DC
Start: 2017-09-03 — End: 2017-09-04
  Administered 2017-09-03: 2 [IU] via SUBCUTANEOUS

## 2017-09-03 MED ORDER — ACETAMINOPHEN 325 MG PO TABS: 650.0000 mg | ORAL_TABLET | Freq: Four times a day (QID) | ORAL | Status: DC | PRN

## 2017-09-03 MED ORDER — ONDANSETRON HCL 4 MG PO TABS
4.0000 mg | ORAL_TABLET | Freq: Three times a day (TID) | ORAL | Status: DC | PRN
  Administered 2017-09-03: 8 mg via ORAL
  Filled 2017-09-03: qty 2

## 2017-09-03 NOTE — H&P (Addendum)
History and Physical    Andre Stanton WUJ:[id number] DOB: 1996/05/04 DOA: 09/02/2017  PCP: Pola Corn, PA-C  Patient coming from: Home.  Chief Complaint: Loss of consciousness.  HPI: Andre Stanton is a 22 y.o. male with history of diabetes mellitus type 1 presents to the ER with complaints of having loss of consciousness.  Patient states over the last 3 days patient had multiple episodes lasting for a few seconds each time.  Patient states over the last 2 months has been having bandlike headache.  Last 3 days headache has got worse.  When the headache becomes acutely worse he loses consciousness for a few seconds.  Denies any incontinence of urine or tongue bite.  No seizure-like activities.  Patient denies any chest pain or palpitations or shortness of breath.  Denies any nausea vomiting or visual symptoms.  ED Course: In the ER CT head and C-spine was unremarkable.  EKG shows normal sinus rhythm with nonspecific ST-T changes.  Patient still has some headache but appears nonfocal.  Headache is mostly bandlike.  Review of Systems: As per HPI, rest all negative.   Past Medical History:  Diagnosis Date  . Diabetes mellitus type 1 (HCC)     History reviewed. No pertinent surgical history.   reports that  has never smoked. he has never used smokeless tobacco. He reports that he drinks alcohol. He reports that he uses drugs. Drug: Marijuana.  No Known Allergies  Family History  Problem Relation Age of Onset  . Seizures Maternal Grandmother     Prior to Admission medications   Medication Sig Start Date End Date Taking? Authorizing Provider  Insulin Glargine (TOUJEO SOLOSTAR [place]) Inject 31 Units into the skin at bedtime.    Yes [provider]  insulin lispro (HUMALOG) 100 UNIT/ML injection Inject into the skin See admin instructions. 1 unit per 10 grams carbohydrates.  Inject at least 3 times daily with meals and as needed for high blood glucose as follows:   151-200=6  units 201-250=8 251-300=10 301-350=12, Etc....   Yes [provider]  clindamycin (CLINDAGEL) 1 % gel Apply topically 2 (two) times daily. Patient not taking: Reported on 01/20/2017 12/28/16   Doristine Bosworth, MD  ondansetron (ZOFRAN ODT) 4 MG disintegrating tablet Take 1 tablet (4 mg total) by mouth every 8 (eight) hours as needed for nausea or vomiting. Patient not taking: Reported on 09/02/2017 01/20/17   Peyton Najjar, MD  sertraline (ZOLOFT) 50 MG tablet Take 1 tablet (50 mg total) by mouth daily. Patient not taking: Reported on 09/02/2017 01/20/17   Peyton Najjar, MD    Physical Exam: Vitals:   09/03/17 0116 09/03/17 0200 09/03/17 0400 09/03/17 0500  BP: 116/74 107/75 103/69 107/69  Pulse: 71 73 84 66  Resp: 12 19 14 15   Temp:      TempSrc:      SpO2: 100% 100% 100% 98%      Constitutional: Moderately built and nourished. Vitals:   09/03/17 0116 09/03/17 0200 09/03/17 0400 09/03/17 0500  BP: 116/74 107/75 103/69 107/69  Pulse: 71 73 84 66  Resp: 12 19 14 15   Temp:      TempSrc:      SpO2: 100% 100% 100% 98%   Eyes: Anicteric no pallor. ENMT: No discharge from the ears eyes nose or mouth. Neck: No mass foot with no neck rigidity.  No JVD appreciated. Respiratory: No rhonchi or crepitations. Cardiovascular: S1-S2 heard no murmurs appreciated. Abdomen: Soft nontender bowel  sounds present.  No guarding or rigidity. Musculoskeletal: No edema.  No joint effusion. Skin: No rash.  Skin appears warm. Neurologic: Alert awake oriented to time place and person.  Moves all extremities 5 x 5.  No facial asymmetry tongue is midline.  Pupils are equal and reactive to light. Psychiatric: Appears normal.  Normal affect.   Labs on Admission: I have personally reviewed following labs and imaging studies  CBC: Recent Labs  Lab 09/02/17 2138  WBC 7.0  NEUTROABS 3.3  HGB 16.1  HCT 42.8  MCV 83.8  PLT 203   Basic Metabolic Panel: Recent Labs  Lab 09/02/17 2138  NA  136  K 3.7  CL 100*  CO2 25  GLUCOSE 310*  BUN 14  CREATININE 0.70  CALCIUM 9.2   GFR: Estimated Creatinine Clearance: 115.5 mL/min (by C-G formula based on SCr of 0.7 mg/dL). Liver Function Tests: Recent Labs  Lab 09/02/17 2138  AST 12*  ALT 11*  ALKPHOS 73  BILITOT 1.0  PROT 7.3  ALBUMIN 4.1   No results for input(s): LIPASE, AMYLASE in the last 168 hours. No results for input(s): AMMONIA in the last 168 hours. Coagulation Profile: No results for input(s): INR, PROTIME in the last 168 hours. Cardiac Enzymes: No results for input(s): CKTOTAL, CKMB, CKMBINDEX, TROPONINI in the last 168 hours. BNP (last 3 results) No results for input(s): PROBNP in the last 8760 hours. HbA1C: No results for input(s): HGBA1C in the last 72 hours. CBG: Recent Labs  Lab 09/02/17 2121  GLUCAP 286*   Lipid Profile: No results for input(s): CHOL, HDL, LDLCALC, TRIG, CHOLHDL, LDLDIRECT in the last 72 hours. Thyroid Function Tests: No results for input(s): TSH, T4TOTAL, FREET4, T3FREE, THYROIDAB in the last 72 hours. Anemia Panel: No results for input(s): VITAMINB12, FOLATE, FERRITIN, TIBC, IRON, RETICCTPCT in the last 72 hours. Urine analysis:    Component Value Date/Time   COLORURINE YELLOW 09/02/2017 2138   APPEARANCEUR CLEAR 09/02/2017 2138   LABSPEC 1.041 (H) 09/02/2017 2138   PHURINE 6.0 09/02/2017 2138   GLUCOSEU >=500 (A) 09/02/2017 2138   HGBUR NEGATIVE 09/02/2017 2138   BILIRUBINUR NEGATIVE 09/02/2017 2138   KETONESUR 80 (A) 09/02/2017 2138   PROTEINUR NEGATIVE 09/02/2017 2138   NITRITE NEGATIVE 09/02/2017 2138   LEUKOCYTESUR NEGATIVE 09/02/2017 2138   Sepsis Labs: @LABRCNTIP (procalcitonin:4,lacticidven:4) )No results found for this or any previous visit (from the past 240 hour(s)).   Radiological Exams on Admission: Ct Head Wo Contrast  Result Date: 09/02/2017 CLINICAL DATA:  Headache, syncope. EXAM: CT HEAD WITHOUT CONTRAST CT CERVICAL SPINE WITHOUT CONTRAST  TECHNIQUE: Multidetector CT imaging of the head and cervical spine was performed following the standard protocol without intravenous contrast. Multiplanar CT image reconstructions of the cervical spine were also generated. COMPARISON:  None. FINDINGS: CT HEAD FINDINGS Brain: No evidence of acute infarction, hemorrhage, hydrocephalus, extra-axial collection or mass lesion/mass effect. Vascular: No hyperdense vessel or unexpected calcification. Skull: Normal. Negative for fracture or focal lesion. Sinuses/Orbits: No acute finding. Other: None. CT CERVICAL SPINE FINDINGS Alignment: Normal. Skull base and vertebrae: No acute fracture. No primary bone lesion or focal pathologic process. Soft tissues and spinal canal: No prevertebral fluid or swelling. No visible canal hematoma. Disc levels:  Normal. Upper chest: Negative. Other: None. IMPRESSION: Normal head CT. Normal cervical spine. Electronically Signed   By: Lupita RaiderJames  Green Jr, M.D.   On: 09/02/2017 23:12   Ct Cervical Spine Wo Contrast  Result Date: 09/02/2017 CLINICAL DATA:  Headache, syncope. EXAM: CT HEAD  WITHOUT CONTRAST CT CERVICAL SPINE WITHOUT CONTRAST TECHNIQUE: Multidetector CT imaging of the head and cervical spine was performed following the standard protocol without intravenous contrast. Multiplanar CT image reconstructions of the cervical spine were also generated. COMPARISON:  None. FINDINGS: CT HEAD FINDINGS Brain: No evidence of acute infarction, hemorrhage, hydrocephalus, extra-axial collection or mass lesion/mass effect. Vascular: No hyperdense vessel or unexpected calcification. Skull: Normal. Negative for fracture or focal lesion. Sinuses/Orbits: No acute finding. Other: None. CT CERVICAL SPINE FINDINGS Alignment: Normal. Skull base and vertebrae: No acute fracture. No primary bone lesion or focal pathologic process. Soft tissues and spinal canal: No prevertebral fluid or swelling. No visible canal hematoma. Disc levels:  Normal. Upper chest:  Negative. Other: None. IMPRESSION: Normal head CT. Normal cervical spine. Electronically Signed   By: Lupita Raider, M.D.   On: 09/02/2017 23:12    EKG: Independently reviewed.  Normal sinus rhythm with acceptable QTC with nonspecific ST-T changes.  Assessment/Plan Principal Problem:   Syncope Active Problems:   Type 1 diabetes mellitus with hyperglycemia (HCC)    1. Syncope -usually happens after headache gets worse.  Discussed with neurologist.  At this time neurologist feels patient symptoms are related headache and was likely vasovagal.  Will observe in telemetry since patient had recurrent episodes.  Check 2D echo.  As per neurologist patient will need no further neurology workup.  CT head was unremarkable. 2. Diabetes mellitus type 1 with hyperglycemia -patient has not taken his Lantus last night for which I have ordered 1 dose now.  Continue to follow CBGs closely.  Recheck metabolic panel is pending. 3. Marijuana positive.   DVT prophylaxis: SCDs. Code Status: Full code. Family Communication: Discussed with patient. Disposition Plan: Home. Consults called: Discussed with neurologist. Admission status: Observation.   Eduard Clos MD [facility] Pager [phone].  If 7PM-7AM, please contact night-coverage [email or web address] Password [email or web address]  09/03/2017, 5:30 AM

## 2017-09-03 NOTE — Progress Notes (Signed)
  Echocardiogram 2D Echocardiogram has been performed.  Andre Stanton, Andre Stanton 09/03/2017, 11:29 AM

## 2017-09-03 NOTE — ED Notes (Signed)
Admitting MD asked for pt to be given something to eat prior to receiving lantus however pt declined eating at this time. Will hold Lantus.

## 2017-09-03 NOTE — Progress Notes (Signed)
[facility] Progress Note  Subjective: feeling much better today, headaches gone, hasn't been on his feet. Orthostatics negative.    Vitals:   09/03/17 0500 09/03/17 0600 09/03/17 0629 09/03/17 1512  BP: 107/69 104/74 116/84 (!) 97/53  Pulse: 66 (!) 55 61 69  Resp: 15 15 16 18   Temp:   97.9 F (36.6 C) 98.5 F (36.9 C)  TempSrc:   Oral Oral  SpO2: 98% 98% 99% 100%  Weight:   55.2 kg (121 lb 12.8 oz)   Height:   5\' 3"  (1.6 m)     Inpatient medications: . enoxaparin (LOVENOX) injection  40 mg Subcutaneous Q24H  . insulin aspart  0-5 Units Subcutaneous QHS  . insulin aspart  1-11 Units Subcutaneous TID WC  . [START ON 09/04/2017] insulin glargine  37 Units Subcutaneous QHS   . sodium chloride 100 mL/hr at 09/03/17 1625  . sodium chloride     acetaminophen **OR** acetaminophen, alum & mag hydroxide-simeth, ondansetron  Exam: Pleasant no distress No jvd Chest clear bilat RRR  Abd soft ntnd No edema NF, ox 3   Brief Summary: Andre Stanton is a 22 y.o. male with history of diabetes mellitus type 1 presents to the ER with complaints of having loss of consciousness.  Patient states over the last 3 days patient had multiple episodes lasting for a few seconds each time.  Patient states over the last 2 months has been having bandlike headache.  Last 3 days headache has got worse.  When the headache becomes acutely worse he loses consciousness for a few seconds.  Denies any incontinence of urine or tongue bite.  No seizure-like activities.  Patient denies any chest pain or palpitations or shortness of breath.  Denies any nausea vomiting or visual symptoms.  In the ER CT head and C-spine was unremarkable.  EKG shows normal sinus rhythm with nonspecific ST-T changes.  Patient still has some headache but appears nonfocal.  Headache is mostly bandlike. Asked to see for admission.          Assessment / Plan  Active problems:  Syncope: associated with HA' s and poorly cont DM1.   Admit MD d/w neurologist, they felt symptoms were headache related and likely vasovagal.  Patient is much better after IVF"s and headaches are now gone. ECHO was normal.  His diabetes control has suffered over the last 6-8 mos, whereas a year ago his BS was 100 -150 , now BS's are in the 200's and 300's.  Suspect primary issue is dehydration severe due to uncont DM1.  Get OOB.  Needs higher insulin dosing, better control - will ^ lantus 31 /d > 37 u/ day - will ^ home SSI to 1- 11 units max - BS's in 100- 175 range - DC tomorrow if continuing to improve.    DMT1:  -as above  Headaches: tension headaches, resolved w/ hydration and BS control.    Vinson Moselleob Anilah Huck MD [facility] pgr [phone] 09/03/2017, 5:13 PM    DVT prophylaxis: SCDs. Code Status: Full code. Family Communication: Discussed with patient. Disposition Plan: Home. Consults called: Discussed with neurologist. Admission status: Observation.    Vinson Moselleob Annel Zunker MD [facility] pgr [phone] 09/03/2017, 5:03 PM   Recent Labs  Lab 09/02/17 2138  NA 136  K 3.7  CL 100*  CO2 25  GLUCOSE 310*  BUN 14  CREATININE 0.70  CALCIUM 9.2   Recent Labs  Lab 09/02/17 2138  AST 12*  ALT  11*  ALKPHOS 73  BILITOT 1.0  PROT 7.3  ALBUMIN 4.1   Recent Labs  Lab 09/02/17 2138  WBC 7.0  NEUTROABS 3.3  HGB 16.1  HCT 42.8  MCV 83.8  PLT 203   Iron/TIBC/Ferritin/ %Sat No results found for: IRON, TIBC, FERRITIN, IRONPCTSAT

## 2017-09-03 NOTE — ED Provider Notes (Signed)
Care assumed from [facility] [place], [place].  Please see her full H&P.  In short,  Andre Stanton is a 22 y.o. male with a history of insulin dependent diabetes presents for 3 episodes of syncope on Wednesday, 2 on Thursday and 3 again today.  Episodes have been witnessed.  No seizure activity.  Patient reports he has had a constant headache for the last week but headache worsens just before he has a syncopal episode.  He denies vision changes, vomiting.  He has no history of migraine.  He does state prodrome prior to syncope.  Physical Exam  BP 111/84   Pulse 61   Temp 98.1 F (36.7 C) (Oral)   Resp 13   SpO2 100%   Physical Exam  Constitutional: He appears well-developed and well-nourished. No distress.  HENT:  Head: Normocephalic.  Eyes: Conjunctivae are normal. No scleral icterus.  Neck: Normal range of motion.  Cardiovascular: Normal rate and intact distal pulses.  Pulmonary/Chest: Effort normal.  Musculoskeletal: Normal range of motion.  Neurological: He is alert.  Skin: Skin is warm and dry.  Nursing note and vitals reviewed.   ED Course/Procedures   Clinical Course as of Sep 04 51  Sat Sep 03, 2017  [id number] Discussed with Dr. Toniann FailKakrakandy who will admit.  [HM]    Clinical Course User Index [HM] Woodie Degraffenreid, Dahlia ClientHannah, PA-C    Procedures  MDM    With recurrent syncope and headache times 1 week.  No thunderclap or worst headache of his life.  Less likely to be subarachnoid hemorrhage.  I am concerned about venous dural thrombosis.  Patient will need MRI/MRV.  Will also need admission for recurrent syncope workup.   Headache disorder  Syncope, unspecified syncope type  IDDM (insulin dependent diabetes mellitus) (HCC)  Hyperglycemia     Elim Economou, Boyd KerbsHannah, PA-C 09/03/17 0053    Palumbo, April, MD 09/03/17 [id number]

## 2017-09-04 DIAGNOSIS — R55 Syncope and collapse: Secondary | ICD-10-CM

## 2017-09-04 DIAGNOSIS — E1065 Type 1 diabetes mellitus with hyperglycemia: Secondary | ICD-10-CM

## 2017-09-04 LAB — CBC WITH DIFFERENTIAL/PLATELET
BASOS PCT: 1 %
Basophils Absolute: 0.1 10*3/uL (ref 0.0–0.1)
EOS ABS: 0.6 10*3/uL (ref 0.0–0.7)
Eosinophils Relative: 9 %
HCT: 42.8 % (ref 39.0–52.0)
Hemoglobin: 16.1 g/dL (ref 13.0–17.0)
Lymphocytes Relative: 34 %
Lymphs Abs: 2.4 10*3/uL (ref 0.7–4.0)
MCH: 31.5 pg (ref 26.0–34.0)
MCHC: 37.6 g/dL — ABNORMAL HIGH (ref 30.0–36.0)
MCV: 83.8 fL (ref 78.0–100.0)
MONO ABS: 0.6 10*3/uL (ref 0.1–1.0)
Monocytes Relative: 9 %
NEUTROS ABS: 3.3 10*3/uL (ref 1.7–7.7)
NEUTROS PCT: 47 %
PLATELETS: 203 10*3/uL (ref 150–400)
RBC: 5.11 MIL/uL (ref 4.22–5.81)
RDW: 12.1 % (ref 11.5–15.5)
WBC: 7 10*3/uL (ref 4.0–10.5)

## 2017-09-04 LAB — GLUCOSE, CAPILLARY
Glucose-Capillary: 246 mg/dL — ABNORMAL HIGH (ref 65–99)
Glucose-Capillary: 113 mg/dL — ABNORMAL HIGH (ref 65–99)

## 2017-09-04 LAB — HIV ANTIBODY (ROUTINE TESTING W REFLEX): HIV SCREEN 4TH GENERATION: NONREACTIVE

## 2017-09-04 MED ORDER — INSULIN ASPART 100 UNIT/ML ~~LOC~~ SOLN: 1.0000 [IU] | Freq: Three times a day (TID) | SUBCUTANEOUS | 11 refills | Status: DC

## 2017-09-04 MED ORDER — INSULIN GLARGINE 300 UNIT/ML ~~LOC~~ SOPN: 37.0000 [IU] | PEN_INJECTOR | Freq: Every day | SUBCUTANEOUS | Status: DC

## 2017-09-04 NOTE — Discharge Summary (Signed)
Physician Discharge Summary  Andre Stanton WUJ:[id number] DOB: Feb 24, 1996 DOA: 09/02/2017  PCP: Pola Corn, PA-C  Admit date: 09/02/2017 Discharge date: 09/04/2017  Time spent: 31 minutes  Recommendations for Outpatient Follow-up:  1. Continue sliding scale as per home dose. 2. Increase Lantus to 37 units subcutaneously   Discharge Diagnoses:  Principal Problem:   Syncope Active Problems:   Type 1 diabetes mellitus with hyperglycemia [facility])   Discharge Condition: Good  Diet recommendation: Diabetic diet  Filed Weights   09/03/17 0629  Weight: 55.2 kg (121 lb 12.8 oz)    History of present illness:  This is a 22 y.o. male diabetic on insulin complaining of recurrent syncopal episodes since the beginning of the week.  States he is passed at least 5 or 6 times for no apparent reason.  Denies fever, chills, or flulike symptoms.  Sugars have been running between 300- 400.  Girlfriend states the patient has been out for several minutes, no seizure activity, no postictal state.  Has been having a headache for 5 days.  Denies visual symptoms, nausea or vomiting, rectal bleeding, melena, or any other significant symptomatology.In the ER CT head and C-spine was unremarkable. EKG shows normal sinus rhythm with nonspecific ST-T changes. Patient still has some headache but appears nonfocal. Headache is mostly bandlike. Asked to see for admission.       Hospital Course:  Patient was admitted and treated for his syncope. It appears he had vasovagal syncope. Echo was fine. He was fully evaluated. He had headache which may have led to syncope. Patient's symptoms was suspected to be more secondary to dehydration due to elevated blood sugar consistently. The Lantus insulin dose was changed to 37 units daily from 30 units daily. He was hydrated aggressively. His headache has resolved and patient is being discharged home to follow-up with PCP  Procedures: Study Conclusions Echo  - Left  ventricle: The cavity size was normal. Wall thickness was   normal. Systolic function was normal. The estimated ejection   fraction was in the range of 60% to 65%. Wall motion was normal;   there were no regional wall motion abnormalities. Left   ventricular diastolic function parameters were normal.  Impressions:  - Normal study  Consultations:  None  Discharge Exam: Vitals:   09/03/17 2100 09/04/17 0612  BP: 111/65 106/77  Pulse: 68 72  Resp: 16 16  Temp: 98.6 F (37 C) 97.9 F (36.6 C)  SpO2: 97% 99%    General: Stable no acute distress Cardiovascular: Regular rate and reason Respiratory: Good air entry bilaterally  Discharge Instructions   Discharge Instructions    Diet Carb Modified   Complete by:  As directed    Increase activity slowly   Complete by:  As directed      Allergies as of 09/04/2017   No Known Allergies     Medication List    TAKE these medications   Insulin Glargine 300 UNIT/ML Sopn Commonly known as:  TOUJEO SOLOSTAR Inject 37 Units into the skin at bedtime. What changed:    medication strength  how much to take   insulin lispro 100 UNIT/ML injection Commonly known as:  HUMALOG Inject into the skin See admin instructions. 1 unit per 10 grams carbohydrates.  Inject at least 3 times daily with meals and as needed for high blood glucose as follows:   151-200=6 units 201-250=8 251-300=10 301-350=12, Etc....      No Known Allergies    The results of  significant diagnostics from this hospitalization (including imaging, microbiology, ancillary and laboratory) are listed below for reference.    Significant Diagnostic Studies: Ct Head Wo Contrast  Result Date: 09/02/2017 CLINICAL DATA:  Headache, syncope. EXAM: CT HEAD WITHOUT CONTRAST CT CERVICAL SPINE WITHOUT CONTRAST TECHNIQUE: Multidetector CT imaging of the head and cervical spine was performed following the standard protocol without intravenous contrast. Multiplanar CT image  reconstructions of the cervical spine were also generated. COMPARISON:  None. FINDINGS: CT HEAD FINDINGS Brain: No evidence of acute infarction, hemorrhage, hydrocephalus, extra-axial collection or mass lesion/mass effect. Vascular: No hyperdense vessel or unexpected calcification. Skull: Normal. Negative for fracture or focal lesion. Sinuses/Orbits: No acute finding. Other: None. CT CERVICAL SPINE FINDINGS Alignment: Normal. Skull base and vertebrae: No acute fracture. No primary bone lesion or focal pathologic process. Soft tissues and spinal canal: No prevertebral fluid or swelling. No visible canal hematoma. Disc levels:  Normal. Upper chest: Negative. Other: None. IMPRESSION: Normal head CT. Normal cervical spine. Electronically Signed   By: Lupita RaiderJames  Green Jr, M.D.   On: 09/02/2017 23:12   Ct Cervical Spine Wo Contrast  Result Date: 09/02/2017 CLINICAL DATA:  Headache, syncope. EXAM: CT HEAD WITHOUT CONTRAST CT CERVICAL SPINE WITHOUT CONTRAST TECHNIQUE: Multidetector CT imaging of the head and cervical spine was performed following the standard protocol without intravenous contrast. Multiplanar CT image reconstructions of the cervical spine were also generated. COMPARISON:  None. FINDINGS: CT HEAD FINDINGS Brain: No evidence of acute infarction, hemorrhage, hydrocephalus, extra-axial collection or mass lesion/mass effect. Vascular: No hyperdense vessel or unexpected calcification. Skull: Normal. Negative for fracture or focal lesion. Sinuses/Orbits: No acute finding. Other: None. CT CERVICAL SPINE FINDINGS Alignment: Normal. Skull base and vertebrae: No acute fracture. No primary bone lesion or focal pathologic process. Soft tissues and spinal canal: No prevertebral fluid or swelling. No visible canal hematoma. Disc levels:  Normal. Upper chest: Negative. Other: None. IMPRESSION: Normal head CT. Normal cervical spine. Electronically Signed   By: Lupita RaiderJames  Green Jr, M.D.   On: 09/02/2017 23:12     Microbiology: No results found for this or any previous visit (from the past 240 hour(s)).   Labs: Basic Metabolic Panel: Recent Labs  Lab 09/02/17 2138 09/03/17 0922  NA 136  --   K 3.7  --   CL 100*  --   CO2 25  --   GLUCOSE 310*  --   BUN 14  --   CREATININE 0.70  --   CALCIUM 9.2  --   MG  --  1.8   Liver Function Tests: Recent Labs  Lab 09/02/17 2138  AST 12*  ALT 11*  ALKPHOS 73  BILITOT 1.0  PROT 7.3  ALBUMIN 4.1   No results for input(s): LIPASE, AMYLASE in the last 168 hours. No results for input(s): AMMONIA in the last 168 hours. CBC: Recent Labs  Lab 09/02/17 2138  WBC 7.0  NEUTROABS 3.3  HGB 16.1  HCT 42.8  MCV 83.8  PLT 203   Cardiac Enzymes: Recent Labs  Lab 09/03/17 0922  TROPONINI <0.03   BNP: BNP (last 3 results) No results for input(s): BNP in the last 8760 hours.  ProBNP (last 3 results) No results for input(s): PROBNP in the last 8760 hours.  CBG: Recent Labs  Lab 09/03/17 1151 09/03/17 1636 09/03/17 2129 09/04/17 0809 09/04/17 1206  GLUCAP 237* 169* 208* 246* 113*       SignedLonia Blood:  GARBA,LAWAL MD.  [facility] 09/04/2017, 12:17 PM

## 2017-09-04 NOTE — Progress Notes (Signed)
D/cing to home w/ friend.Awaiting ride and MD to change AVS r/t insulin.

## 2017-09-04 NOTE — Progress Notes (Signed)
D/c'd to home w/ friend all d/c instructions given w/ verbal understanding.

## 2019-09-04 IMAGING — CT CT CERVICAL SPINE W/O CM
4 of 7 series · 14 of 33 positions shown, 15 images · non-contrast
Comparison: None.

CLINICAL DATA: Headache, syncope.

EXAM:
CT HEAD WITHOUT CONTRAST
CT CERVICAL SPINE WITHOUT CONTRAST
TECHNIQUE: Multidetector CT imaging of the head and cervical spine was
performed following the standard protocol without intravenous
contrast. Multiplanar CT image reconstructions of the cervical spine
were also generated.

[Series 4: coronal soft tissue · coronal · 0.29mm/px · 3 of 67 slices shown]
[im 17/67  bone]
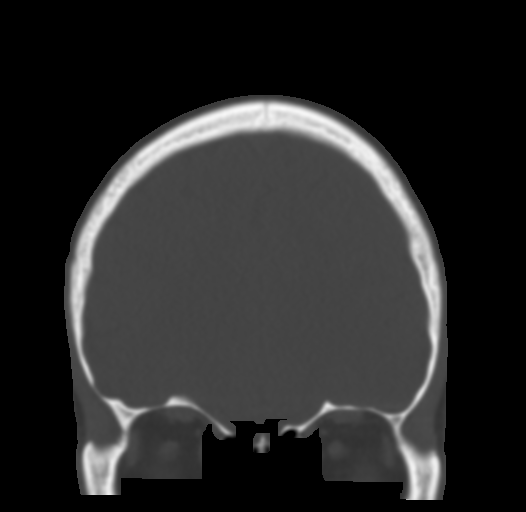
[im 34/67  bone]
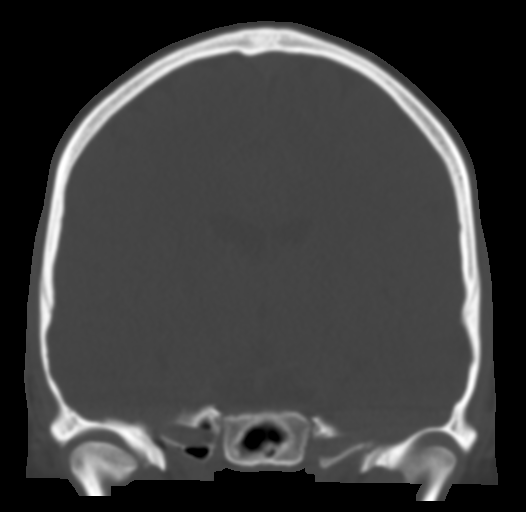
[im 50/67  bone]
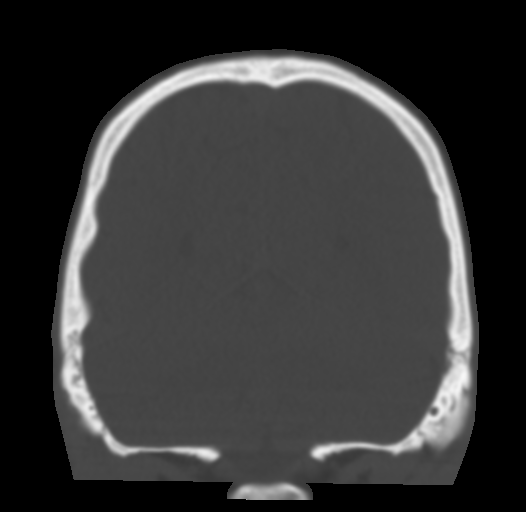

[Series 8: c spine soft · axial · 0.31mm/px · z∈[-342,-306]mm · 2 of 90 slices shown]
[im 18/90  soft-tissue]
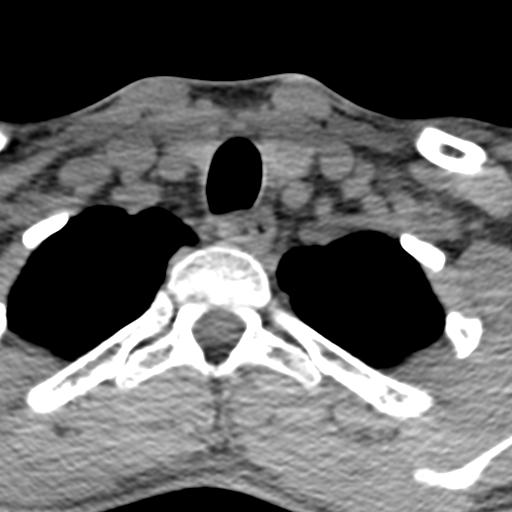
[im 36/90  soft-tissue]
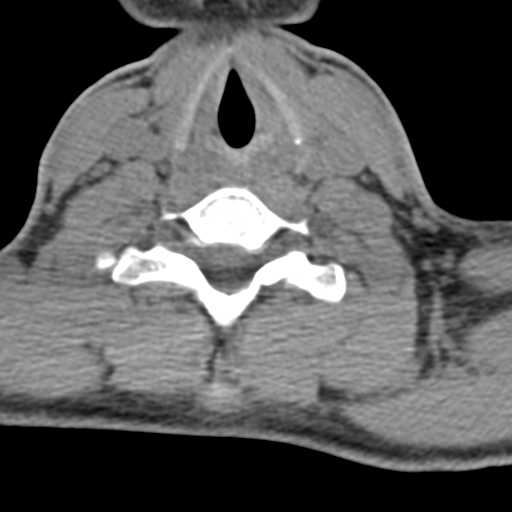

[Series 10: orthogonal bone · axial · 0.21mm/px · z∈[-353,-245]mm · 4 of 103 slices shown, 5 images]
[im 21/103  soft-tissue]
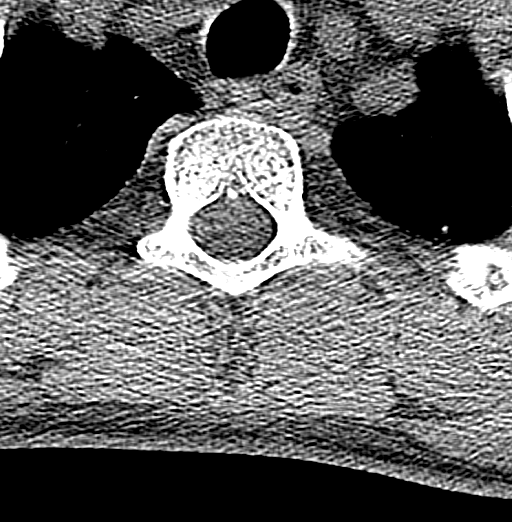
[im 21/103  bone]
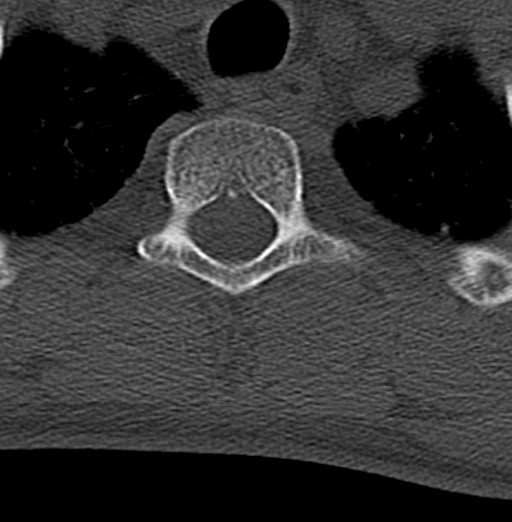
[im 41/103  bone]
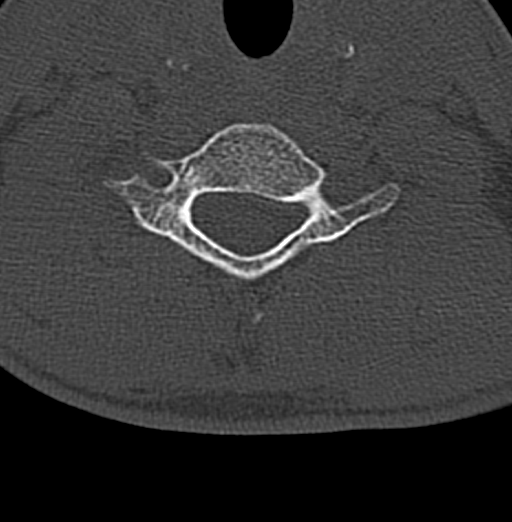
[im 62/103  bone]
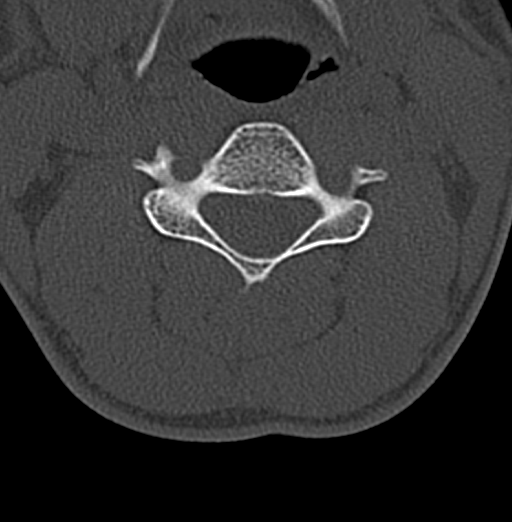
[im 82/103  bone]
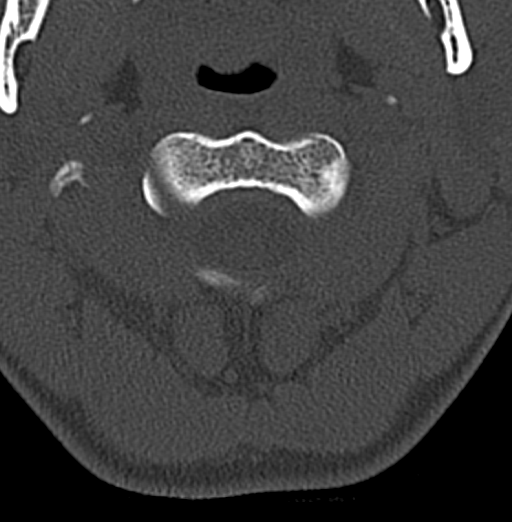

[Series 12: sagittal bone · sagittal · 0.21mm/px · 5 of 53 slices shown]
[im 9/53  bone]
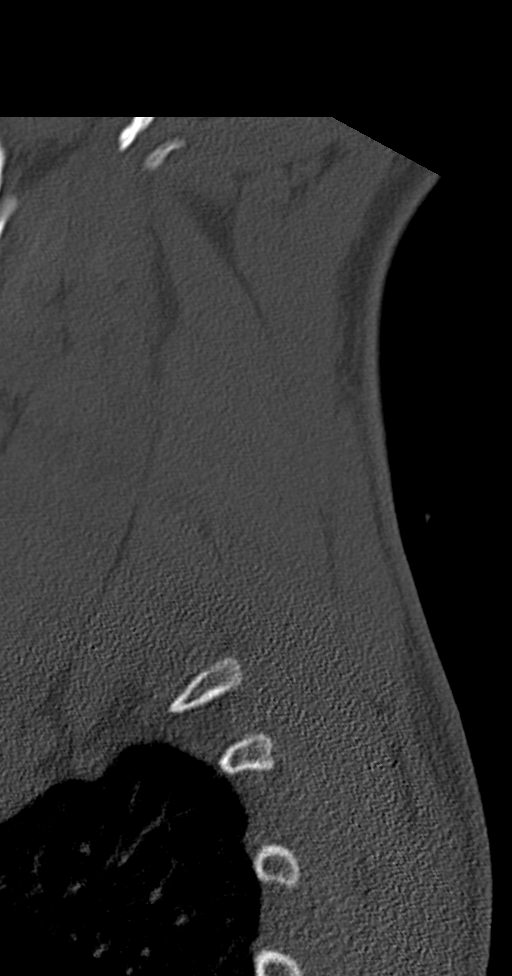
[im 18/53  bone]
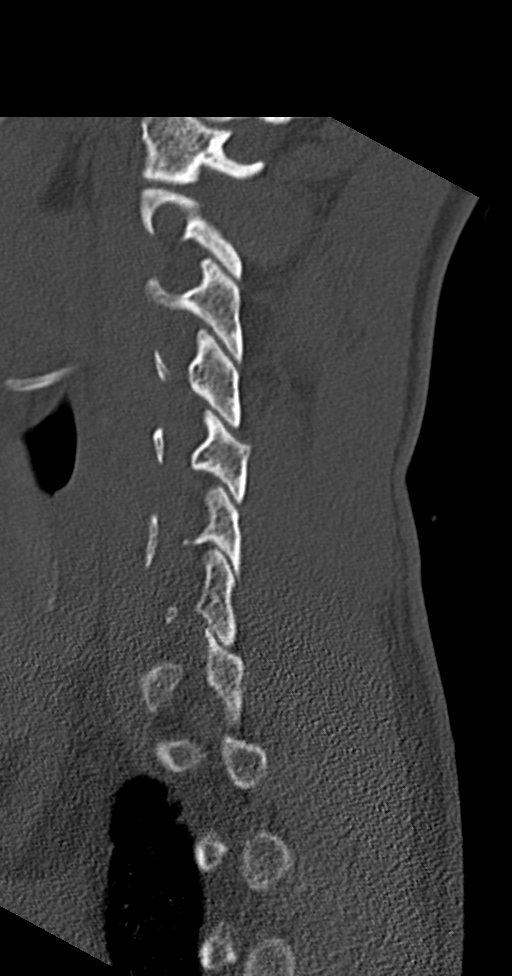
[im 27/53  bone]
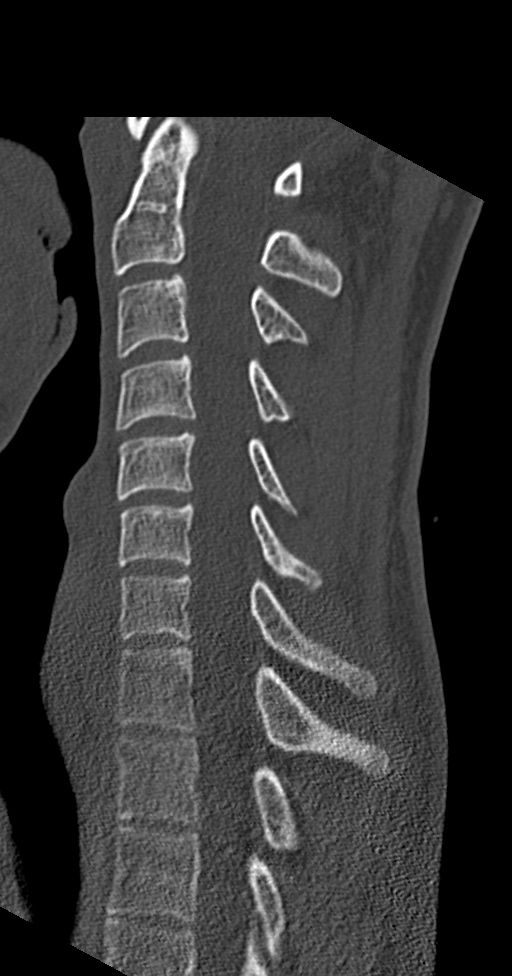
[im 35/53  bone]
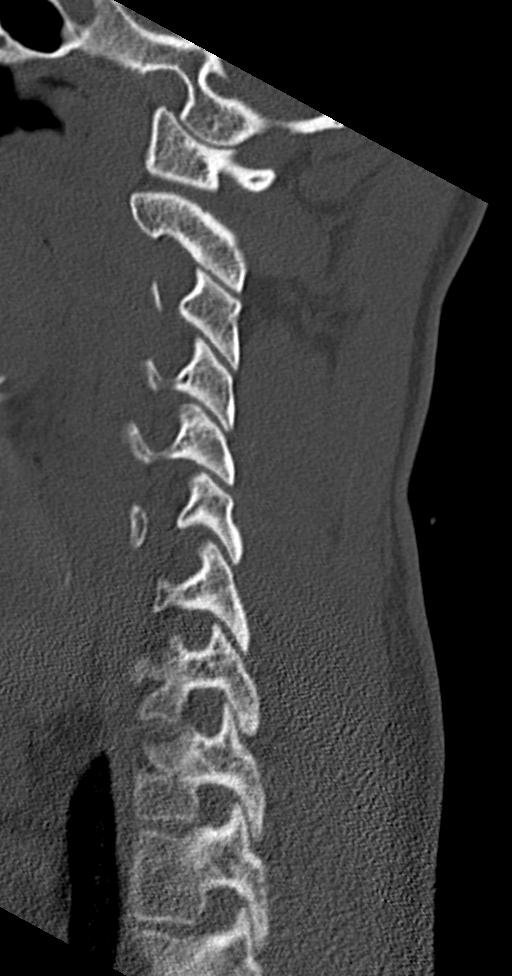
[im 44/53  bone]
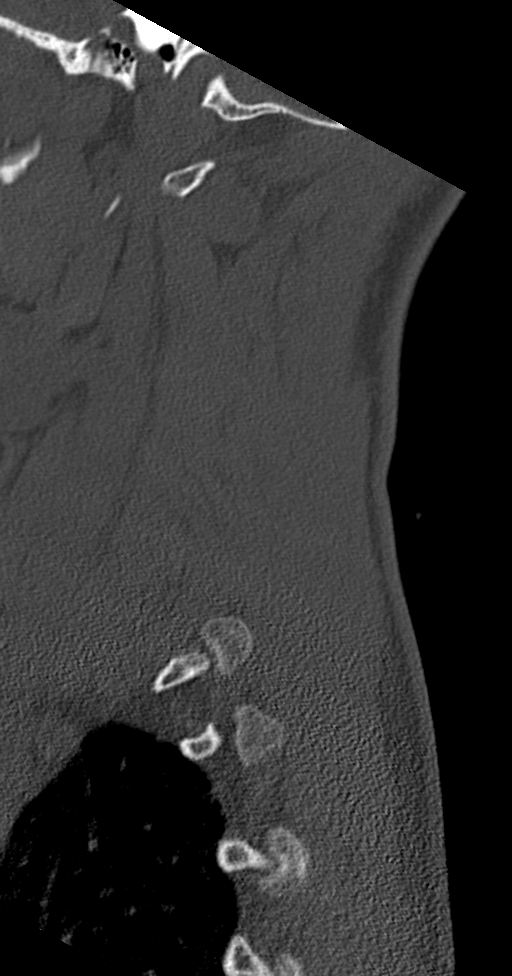

[14 of 33 positions shown; findings below may reference images not displayed]

FINDINGS: CT HEAD FINDINGS

Brain: No evidence of acute infarction, hemorrhage, hydrocephalus,
extra-axial collection or mass lesion/mass effect.

Vascular: No hyperdense vessel or unexpected calcification.

Skull: Normal. Negative for fracture or focal lesion.

Sinuses/Orbits: No acute finding.

Other: None.

CT CERVICAL SPINE FINDINGS

Alignment: Normal.

Skull base and vertebrae: No acute fracture. No primary bone lesion
or focal pathologic process.

Soft tissues and spinal canal: No prevertebral fluid or swelling. No
visible canal hematoma.

Disc levels:  Normal.

Upper chest: Negative.

Other: None.
IMPRESSION: Normal head CT.

Normal cervical spine.

## 2022-05-05 ENCOUNTER — Telehealth: Payer: Self-pay | Admitting: Family Medicine

## 2022-05-05 ENCOUNTER — Ambulatory Visit: Payer: PRIVATE HEALTH INSURANCE | Admitting: Family Medicine

## 2022-05-05 NOTE — Telephone Encounter (Signed)
Pt called to cancel at 11:30a - marked as 1st late cancel/no show, fee waived

## 2022-05-05 NOTE — Telephone Encounter (Signed)
Caller Name: Samantha Olivera Call back phone #: [phone]  Reason for Call: Pt called in at 11am to cancel NP appointment for this afternoon with Dr.Thompson. This is his first, letter has been sent

## 2022-05-18 ENCOUNTER — Ambulatory Visit (INDEPENDENT_AMBULATORY_CARE_PROVIDER_SITE_OTHER): Payer: Commercial Managed Care - PPO | Admitting: Family Medicine

## 2022-05-18 ENCOUNTER — Encounter: Payer: Self-pay | Admitting: Family Medicine

## 2022-05-18 VITALS — BP 120/64 | HR 84 | Temp 97.3°F | Ht 64.0 in | Wt 136.0 lb

## 2022-05-18 DIAGNOSIS — E1065 Type 1 diabetes mellitus with hyperglycemia: Secondary | ICD-10-CM | POA: Diagnosis not present

## 2022-05-18 DIAGNOSIS — L608 Other nail disorders: Secondary | ICD-10-CM

## 2022-05-18 NOTE — Patient Instructions (Signed)
We are referring to endocrinology.   If you get low on diabetic supplies before establishing locally, please reach out to your old endocrinologist for refills. If they will not refill you, we can do that based on your old records.

## 2022-05-18 NOTE — Progress Notes (Signed)
Assessment/Plan:  Spent 45 minutes with patient cussing history and reviewing chart Problem List Items Addressed This Visit       Endocrine   Type 1 diabetes mellitus with hyperglycemia ([place]) - Primary   Relevant Orders   Ambulatory referral to Endocrinology     Musculoskeletal and Integument   Deformity of nail bed    Third left digit No signs of paronychia or other infection Appears to be improving as the nail grows out, recommend conservative management and continue to monitor Provided reassurance patient Patient can follow-up if no improvement           Subjective:  HPI:  Andre Stanton is a 26 y.o. male who has Type 1 diabetes mellitus with hyperglycemia ([place]) and Deformity of nail bed on their problem list..   He  has a past medical history of Diabetes mellitus type 1 (Andre Stanton).[name]   He presents with chief complaint of Establish Care (New Patient/Referral For Endocrinology/Left middle finger (nail)///) .  Patient is [place].  He moved to [place] for work about 3 years ago.  He recently moved back to [place] due to "unfortunate circumstances".  Patient did not elaborate further.  Nailbed injury.  Patient reports a left middle finger nailbed injury approximately 2 months ago.  He reports that the nail has grown back "on".  He has no pain, drainage, redness, swelling, fevers.  He has been clipping the nails as they grow.   Diabetes Mellitus Type I, Initial: Patient here for evaluation of Type 1 diabetes mellitus.  Previously followed with [facility] endocrinology, last seen 2019.  Hemoglobin A1c at that time was 8.7.  Patient also with CMP drawn in 2019 with CBG in the 300s, remainder of lab grossly normal.  In 2018 patient had normal urine microalbumin creatinine ratio.  In the past 2 years.  Patient has followed with endocrinology in [place].  He was transitioned to an insulin pump at that time.  It is primed with Humalog.  Reports that he has enough  supplies to last for several months.  His clinical course has been stable. Insulin dosage review with Jhalil suggested compliance most of the time. Associated symptoms of hyperglycemia have been none.  Associated symptoms of hypoglycemia have been none.   He is currently taking  Humalog insulin pump   Compliance with blood glucose monitoring: excellent.   Blood glucose times and ranges:            Overall around 110 mg/dl      History reviewed. No pertinent surgical history.  Outpatient Medications Prior to Visit  Medication Sig Dispense Refill   Insulin Glargine (TOUJEO SOLOSTAR) 300 UNIT/ML SOPN Inject 37 Units into the skin at bedtime.     insulin lispro (HUMALOG) 100 UNIT/ML injection Inject into the skin See admin instructions. 1 unit per 10 grams carbohydrates.  Inject at least 3 times daily with meals and as needed for high blood glucose as follows:   151-200=6 units 201-250=8 251-300=10 301-350=12, Etc....     No facility-administered medications prior to visit.    Family History  Problem Relation Age of Onset   Seizures Maternal Grandmother     Social History   Socioeconomic History   Marital status: Single    Spouse name: Not on file   Number of children: Not on file   Years of education: Not on file   Highest education level: Not on file  Occupational History   Not on file  Tobacco  Use   Smoking status: Never   Smokeless tobacco: Never  Vaping Use   Vaping Use: Never used  Substance and Sexual Activity   Alcohol use: Yes    Comment: occasionally   Drug use: Yes    Types: Marijuana    Comment: daily use   Sexual activity: Not on file  Other Topics Concern   Not on file  Social History Narrative   Not on file   Social Determinants of Health   Financial Resource Strain: Not on file  Food Insecurity: Not on file  Transportation Needs: Not on file  Physical Activity: Not on file  Stress: Not on file  Social Connections: Not on file  Intimate  Partner Violence: Not on file                                                                                                 Objective:  Physical Exam: BP 120/64 (BP Location: Right Arm, Patient Position: Sitting, Cuff Size: Normal)   Pulse 84   Temp (!) 97.3 F (36.3 C) (Temporal)   Ht 5\' 4"  (1.626 m)   Wt 136 lb (61.7 kg)   SpO2 98%   BMI 23.34 kg/m    General: No acute distress. Awake and conversant.  Eyes: Normal conjunctiva, anicteric. Round symmetric pupils.  ENT: Hearing grossly intact. No nasal discharge.  Neck: Neck is supple. No masses or thyromegaly.  Respiratory: Respirations are non-labored. No auditory wheezing.  Skin: Warm. No rashes or ulcers.  Left third digit lateral aspect of nail with mild deformity and thickening, there is no surrounding erythema, tenderness, or drainage Psych: Alert and oriented. Cooperative, Appropriate mood and affect, Normal judgment.  CV: No cyanosis or JVD MSK: Normal ambulation. No clubbing  Neuro: Sensation and CN II-XII grossly normal.        , MD, MS

## 2022-05-18 NOTE — Assessment & Plan Note (Signed)
Third left digit No signs of paronychia or other infection Appears to be improving as the nail grows out, recommend conservative management and continue to monitor Provided reassurance patient Patient can follow-up if no improvement

## 2022-05-24 ENCOUNTER — Ambulatory Visit (INDEPENDENT_AMBULATORY_CARE_PROVIDER_SITE_OTHER): Payer: Commercial Managed Care - PPO | Admitting: Family Medicine

## 2022-05-24 ENCOUNTER — Encounter: Payer: Self-pay | Admitting: Family Medicine

## 2022-05-24 VITALS — BP 134/82 | HR 78 | Temp 97.6°F | Wt 131.8 lb

## 2022-05-24 DIAGNOSIS — F22 Delusional disorders: Secondary | ICD-10-CM | POA: Insufficient documentation

## 2022-05-24 DIAGNOSIS — F339 Major depressive disorder, recurrent, unspecified: Secondary | ICD-10-CM

## 2022-05-24 DIAGNOSIS — F1220 Cannabis dependence, uncomplicated: Secondary | ICD-10-CM

## 2022-05-24 DIAGNOSIS — R443 Hallucinations, unspecified: Secondary | ICD-10-CM | POA: Diagnosis not present

## 2022-05-24 DIAGNOSIS — E1065 Type 1 diabetes mellitus with hyperglycemia: Secondary | ICD-10-CM

## 2022-05-24 LAB — URINALYSIS, ROUTINE W REFLEX MICROSCOPIC
Bilirubin Urine: NEGATIVE
Hgb urine dipstick: NEGATIVE
Ketones, ur: 15 — AB
Leukocytes,Ua: NEGATIVE
Nitrite: NEGATIVE
RBC / HPF: NONE SEEN (ref 0–?)
Specific Gravity, Urine: 1.025 (ref 1.000–1.030)
Total Protein, Urine: NEGATIVE
Urine Glucose: 100 — AB
Urobilinogen, UA: 0.2 (ref 0.0–1.0)
pH: 6 (ref 5.0–8.0)

## 2022-05-24 LAB — MICROALBUMIN / CREATININE URINE RATIO
Creatinine,U: 172.8 mg/dL
Microalb Creat Ratio: 0.5 mg/g (ref 0.0–30.0)
Microalb, Ur: 0.9 mg/dL (ref 0.0–1.9)

## 2022-05-24 NOTE — Patient Instructions (Addendum)
Recommend cessation of all substances including marijuanna, THC, alcohol, nicotine or other substances We are checking labs.  If worsening, follow up in office.    For urgent psychiatric assistance you can go to   [facility] Phone: [phone]  [address] [place], [place] [address]  Hours: Open 24/7, No appointment required.  [place], [place] [address] Phone: [phone]  When you're going through tough times, it's easy to feel lonely and overwhelmed. Remember that YOU ARE NOT ALONE and we at [facility] want to support you during this difficult time.  There are many ways to get help and support when you are ready.  You can call the following help lines: - National suicide hotline 925-104-8342) - [place] (1-800-SUICIDE)   You can schedule an appointment with a team member with your primary care doc or one of our counselors.  We are all here to support you.  A doctor is on call 24/7   There are many treatments that can help people during difficult times, and we can talk with you about medications, counseling, diet, and other options. We want to offer you HOPE that it won't always feel this bad.   If you are seriously thinking about hurting yourself or have a plan, please call 911 or go to any emergency room right away for immediate help.

## 2022-05-24 NOTE — Progress Notes (Signed)
Assessment/Plan:   Problem List Items Addressed This Visit       Endocrine   Type 1 diabetes mellitus with hyperglycemia (HCC)   Relevant Medications   HUMALOG 100 UNIT/ML injection   Other Relevant Orders   Comp Met (CMET)   CBC w/Diff   TSH   Lipid panel   Microalbumin / creatinine urine ratio   Hemoglobin A1c   Urinalysis, Routine w reflex microscopic   ToxASSURE Select 13 (MW), Urine     Other   Acute paranoia (HCC) - Primary    Acute paranoia with auditory hallucinations, likely substance related from [facility] use, without SI or HI, patient is a good historian and has normal mentation Does have history of type 1 diabetes and has not had recent lab work We will pursue work-up to rule out metabolic disease associated with diabetes, other endocrine, potential infection, substance use work-up Recommend cessation of marijuana and other substances Offered medication and or referral to therapy and psychiatry Patient declined at this time We will pursue lab work-up if there are abnormalities Patient recommend follow-up closely if no improvement of symptoms      Relevant Orders   Comp Met (CMET)   CBC w/Diff   TSH   Lipid panel   Microalbumin / creatinine urine ratio   Hemoglobin A1c   Urinalysis, Routine w reflex microscopic   ToxASSURE Select 13 (MW), Urine   Other Visit Diagnoses     Moderate tetrahydrocannabinol (THC) dependence (HCC)       Hallucinations       Episode of recurrent major depressive disorder, unspecified depression episode severity (HCC)              Subjective:  HPI:  Andre Stanton is a 26 y.o. male who has Type 1 diabetes mellitus with hyperglycemia (HCC); Deformity of nail bed; and Acute paranoia (HCC) on their problem list..   He  has a past medical history of Diabetes mellitus type 1 (HCC).Andre Stanton   He presents with chief complaint of Other (Hearing voices while out in public on Thursday and past x 2 weeks. ) .   Psychosis. Patient reports  sudden onset of auditory hallucinations described as "hearing voices" about 2-3 weeks ago. He reports no history of psychosis. He is not having visual hallucinations.  He states they are getting better and less frequent.  He tends to hear them in times of stress or when "nothing" is going on.  He describes them as "feeling under investigation".  They are not telling to hurt self or others.  He is no personal thought of harming him self or others.  He reports they started about 2 weeks ago at her a basket ball game.  He reports that he took "too many THC edibles".  He then "saw a lot a people who were not there" and "hearing things going on" that he could not find out where they were occurring.  He denies alcohol use or other illicit substance use.  He does smoke about 5 cigarettes a day.  Patient says he uses about 1 gram of marijuana a day.  Depression Patient with a history of depression.  Reports that its been ongoing for "long time", for several years, does not provide exact timeframe.  Patient has not been prescribed antidepressants.  He said he did try his brothers and present 1 time.  He did not know which one this was.  States that it did not help.  He also saw that his  brother had difficulty with antidepressants, but did not further delineate what they were.  This makes him hesitant to pursue trying a medication.  Patient has been in therapy in the past.  Reports that has not helpful for him.  He last saw therapy 6 months ago in [place].  Patient declines referral to therapy or psychiatry at this time.  Patient denies any history of hospitalizations for psychiatric illness.     05/24/2022    3:04 PM  Depression screen PHQ 2/9  Decreased Interest 0  Down, Depressed, Hopeless 1  PHQ - 2 Score 1  Altered sleeping 3  Tired, decreased energy 1  Change in appetite 3  Feeling bad or failure about yourself  1  Trouble concentrating 2  Moving slowly or fidgety/restless 3  Suicidal thoughts 0   PHQ-9 Score 14  Difficult doing work/chores Not difficult at all       05/24/2022    3:05 PM  GAD 7 : Generalized Anxiety Score  Nervous, Anxious, on Edge 3  Control/stop worrying 3  Worry too much - different things 3  Trouble relaxing 3  Restless 3  Easily annoyed or irritable 0  Afraid - awful might happen 3  Total GAD 7 Score 18  Anxiety Difficulty Not difficult at all    ROS: No SI or HI, no polydipsia, polyuria, fevers, chills, chest pain, shortness of breath  No past surgical history on file.  Outpatient Medications Prior to Visit  Medication Sig Dispense Refill   Continuous Blood Gluc Sensor (DEXCOM G6 SENSOR) MISC SMARTSIG:Topical Every 10 Days     Continuous Blood Gluc Transmit (DEXCOM G6 TRANSMITTER) MISC USE TO TEST BLOOD GLUCOSE. CHANGE EVERY 3 MONTHS     HUMALOG 100 UNIT/ML injection SMARTSIG:0-100 Unit(s) SUB-Q Daily     No facility-administered medications prior to visit.    Family History  Problem Relation Age of Onset   Seizures Maternal Grandmother     Social History   Socioeconomic History   Marital status: Single    Spouse name: Not on file   Number of children: Not on file   Years of education: Not on file   Highest education level: Not on file  Occupational History   Not on file  Tobacco Use   Smoking status: Never   Smokeless tobacco: Never  Vaping Use   Vaping Use: Never used  Substance and Sexual Activity   Alcohol use: Yes    Comment: occasionally   Drug use: Yes    Types: Marijuana    Comment: daily use   Sexual activity: Not on file  Other Topics Concern   Not on file  Social History Narrative   Not on file   Social Determinants of Health   Financial Resource Strain: Not on file  Food Insecurity: Not on file  Transportation Needs: Not on file  Physical Activity: Not on file  Stress: Not on file  Social Connections: Not on file  Intimate Partner Violence: Not on file  Objective:  Physical Exam: BP 134/82 (BP Location: Left Arm, Patient Position: Sitting, Cuff Size: Large)   Pulse 78   Temp 97.6 F (36.4 C) (Temporal)   Wt 131 lb 12.8 oz (59.8 kg)   SpO2 97%   BMI 22.62 kg/m    General: No acute distress. Awake and conversant.  Eyes: Normal conjunctiva, anicteric. Round symmetric pupils.  ENT: Hearing grossly intact. No nasal discharge.  Neck: Neck is supple. No masses or thyromegaly.  Respiratory: Respirations are non-labored. No auditory wheezing.  CTA B Skin: Warm. No rashes or ulcers.  Psych: Alert and oriented. Cooperative, Appropriate mood and affect, Normal judgment.  CV: No cyanosis or JVD, RRR, no MRG MSK: Normal ambulation. No clubbing  Neuro: Sensation and CN II-XII grossly normal.        Alesia Banda, MD, MS

## 2022-05-24 NOTE — Assessment & Plan Note (Signed)
Acute paranoia with auditory hallucinations, likely substance related from [facility] use, without SI or HI, patient is a good historian and has normal mentation Does have history of type 1 diabetes and has not had recent lab work We will pursue work-up to rule out metabolic disease associated with diabetes, other endocrine, potential infection, substance use work-up Recommend cessation of marijuana and other substances Offered medication and or referral to therapy and psychiatry Patient declined at this time We will pursue lab work-up if there are abnormalities Patient recommend follow-up closely if no improvement of symptoms

## 2022-05-25 LAB — COMPREHENSIVE METABOLIC PANEL
ALT: 14 U/L (ref 0–53)
AST: 13 U/L (ref 0–37)
Albumin: 4.7 g/dL (ref 3.5–5.2)
Alkaline Phosphatase: 74 U/L (ref 39–117)
BUN: 13 mg/dL (ref 6–23)
CO2: 29 mEq/L (ref 19–32)
Calcium: 9.6 mg/dL (ref 8.4–10.5)
Chloride: 101 mEq/L (ref 96–112)
Creatinine, Ser: 0.88 mg/dL (ref 0.40–1.50)
GFR: 118.84 mL/min (ref >60.00)
Glucose, Bld: 203 mg/dL — ABNORMAL HIGH (ref 70–99)
Potassium: 3.9 mEq/L (ref 3.5–5.1)
Sodium: 137 mEq/L (ref 135–145)
Total Bilirubin: 0.8 mg/dL (ref 0.2–1.2)
Total Protein: 7.4 g/dL (ref 6.0–8.3)

## 2022-05-25 LAB — LIPID PANEL
Cholesterol: 195 mg/dL (ref 0–200)
HDL: 55.7 mg/dL (ref >39.00)
LDL Cholesterol: 128 mg/dL — ABNORMAL HIGH (ref 0–99)
NonHDL: 138.83
Total CHOL/HDL Ratio: 3
Triglycerides: 56 mg/dL (ref 0.0–149.0)
VLDL: 11.2 mg/dL (ref 0.0–40.0)

## 2022-05-25 LAB — CBC WITH DIFFERENTIAL/PLATELET
Basophils Absolute: 0.1 10*3/uL (ref 0.0–0.1)
Basophils Relative: 1.3 % (ref 0.0–3.0)
Eosinophils Absolute: 0.2 10*3/uL (ref 0.0–0.7)
Eosinophils Relative: 3.3 % (ref 0.0–5.0)
HCT: 47.3 % (ref 39.0–52.0)
Hemoglobin: 16.8 g/dL (ref 13.0–17.0)
Lymphocytes Relative: 24.7 % (ref 12.0–46.0)
Lymphs Abs: 1.6 10*3/uL (ref 0.7–4.0)
MCHC: 35.4 g/dL (ref 30.0–36.0)
MCV: 87.5 fl (ref 78.0–100.0)
Monocytes Absolute: 0.6 10*3/uL (ref 0.1–1.0)
Monocytes Relative: 9.2 % (ref 3.0–12.0)
Neutro Abs: 4.1 10*3/uL (ref 1.4–7.7)
Neutrophils Relative %: 61.5 % (ref 43.0–77.0)
Platelets: 249 10*3/uL (ref 150.0–400.0)
RBC: 5.41 Mil/uL (ref 4.22–5.81)
RDW: 13.1 % (ref 11.5–15.5)
WBC: 6.6 10*3/uL (ref 4.0–10.5)

## 2022-05-25 LAB — HEMOGLOBIN A1C: Hgb A1c MFr Bld: 6.8 % — ABNORMAL HIGH (ref 4.6–6.5)

## 2022-05-25 LAB — TSH: TSH: 0.9 u[IU]/mL (ref 0.35–5.50)

## 2022-05-27 LAB — TOXASSURE SELECT 13 (MW), URINE

## 2022-06-14 ENCOUNTER — Encounter: Payer: Self-pay | Admitting: Family Medicine

## 2022-06-14 DIAGNOSIS — F22 Delusional disorders: Secondary | ICD-10-CM

## 2022-06-14 DIAGNOSIS — F339 Major depressive disorder, recurrent, unspecified: Secondary | ICD-10-CM

## 2022-07-12 ENCOUNTER — Encounter (HOSPITAL_COMMUNITY): Payer: Self-pay | Admitting: Psychiatry

## 2022-07-12 ENCOUNTER — Ambulatory Visit (HOSPITAL_BASED_OUTPATIENT_CLINIC_OR_DEPARTMENT_OTHER): Payer: Commercial Managed Care - PPO | Admitting: Psychiatry

## 2022-07-12 VITALS — Wt 135.0 lb

## 2022-07-12 DIAGNOSIS — T1490XA Injury, unspecified, initial encounter: Secondary | ICD-10-CM

## 2022-07-12 DIAGNOSIS — F419 Anxiety disorder, unspecified: Secondary | ICD-10-CM

## 2022-07-12 DIAGNOSIS — F200 Paranoid schizophrenia: Secondary | ICD-10-CM | POA: Diagnosis not present

## 2022-07-12 DIAGNOSIS — F1210 Cannabis abuse, uncomplicated: Secondary | ICD-10-CM | POA: Diagnosis not present

## 2022-07-12 MED ORDER — HYDROXYZINE PAMOATE 25 MG PO CAPS: 25.0000 mg | ORAL_CAPSULE | Freq: Every evening | ORAL | 0 refills | Status: DC | PRN

## 2022-07-12 MED ORDER — CARIPRAZINE HCL 1.5 MG PO CAPS: 1.5000 mg | ORAL_CAPSULE | Freq: Every day | ORAL | 0 refills | Status: DC
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
## 2022-07-23 ENCOUNTER — Other Ambulatory Visit (HOSPITAL_COMMUNITY): Payer: Self-pay | Admitting: *Deleted

## 2022-08-11 ENCOUNTER — Encounter (HOSPITAL_COMMUNITY): Payer: Self-pay | Admitting: Psychiatry

## 2022-08-11 ENCOUNTER — Telehealth (HOSPITAL_BASED_OUTPATIENT_CLINIC_OR_DEPARTMENT_OTHER): Payer: Commercial Managed Care - PPO | Admitting: Psychiatry

## 2022-08-11 DIAGNOSIS — F1210 Cannabis abuse, uncomplicated: Secondary | ICD-10-CM | POA: Diagnosis not present

## 2022-08-11 DIAGNOSIS — F419 Anxiety disorder, unspecified: Secondary | ICD-10-CM

## 2022-08-11 DIAGNOSIS — F200 Paranoid schizophrenia: Secondary | ICD-10-CM | POA: Diagnosis not present

## 2022-08-11 MED ORDER — CARIPRAZINE HCL 1.5 MG PO CAPS: 1.5000 mg | ORAL_CAPSULE | Freq: Every day | ORAL | 1 refills | Status: DC
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
## 2022-09-23 ENCOUNTER — Telehealth (HOSPITAL_COMMUNITY): Payer: Self-pay | Admitting: Psychiatry

## 2022-10-04 ENCOUNTER — Encounter (HOSPITAL_COMMUNITY): Payer: Self-pay | Admitting: Psychiatry

## 2022-10-04 ENCOUNTER — Telehealth (HOSPITAL_BASED_OUTPATIENT_CLINIC_OR_DEPARTMENT_OTHER): Payer: Commercial Managed Care - PPO | Admitting: Psychiatry

## 2022-10-04 DIAGNOSIS — F419 Anxiety disorder, unspecified: Secondary | ICD-10-CM

## 2022-10-04 DIAGNOSIS — F1210 Cannabis abuse, uncomplicated: Secondary | ICD-10-CM | POA: Diagnosis not present

## 2022-10-04 DIAGNOSIS — F200 Paranoid schizophrenia: Secondary | ICD-10-CM | POA: Diagnosis not present

## 2022-10-04 MED ORDER — HYDROXYZINE PAMOATE 25 MG PO CAPS: 25.0000 mg | ORAL_CAPSULE | Freq: Every evening | ORAL | 0 refills | Status: AC | PRN

## 2022-10-04 MED ORDER — CARIPRAZINE HCL 3 MG PO CAPS: 3.0000 mg | ORAL_CAPSULE | Freq: Every day | ORAL | 1 refills | Status: DC
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
## 2022-10-06 ENCOUNTER — Encounter: Payer: Self-pay | Admitting: Family Medicine

## 2022-10-06 DIAGNOSIS — E1065 Type 1 diabetes mellitus with hyperglycemia: Secondary | ICD-10-CM

## 2022-10-11 MED ORDER — DEXCOM G6 TRANSMITTER MISC: 3 refills | Status: AC

## 2022-10-11 MED ORDER — DEXCOM G6 SENSOR MISC: 3 refills | Status: AC

## 2022-12-08 ENCOUNTER — Telehealth (HOSPITAL_BASED_OUTPATIENT_CLINIC_OR_DEPARTMENT_OTHER): Payer: Commercial Managed Care - PPO | Admitting: Psychiatry

## 2022-12-08 ENCOUNTER — Encounter (HOSPITAL_COMMUNITY): Payer: Self-pay | Admitting: Psychiatry

## 2022-12-08 VITALS — Wt 150.0 lb

## 2022-12-08 DIAGNOSIS — F200 Paranoid schizophrenia: Secondary | ICD-10-CM | POA: Diagnosis not present

## 2022-12-08 DIAGNOSIS — F419 Anxiety disorder, unspecified: Secondary | ICD-10-CM | POA: Diagnosis not present

## 2022-12-08 DIAGNOSIS — F1210 Cannabis abuse, uncomplicated: Secondary | ICD-10-CM

## 2022-12-08 MED ORDER — CARIPRAZINE HCL 3 MG PO CAPS: 3.0000 mg | ORAL_CAPSULE | Freq: Every day | ORAL | 2 refills | Status: DC
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
## 2023-03-09 ENCOUNTER — Telehealth (HOSPITAL_COMMUNITY): Payer: Commercial Managed Care - PPO | Admitting: Psychiatry

## 2023-04-19 ENCOUNTER — Other Ambulatory Visit (HOSPITAL_COMMUNITY): Payer: Self-pay

## 2023-04-19 ENCOUNTER — Encounter (HOSPITAL_COMMUNITY): Payer: Self-pay

## 2023-04-19 ENCOUNTER — Other Ambulatory Visit (HOSPITAL_COMMUNITY): Payer: Self-pay | Admitting: Psychiatry

## 2023-04-19 DIAGNOSIS — F1210 Cannabis abuse, uncomplicated: Secondary | ICD-10-CM

## 2023-04-19 DIAGNOSIS — F200 Paranoid schizophrenia: Secondary | ICD-10-CM

## 2023-04-19 DIAGNOSIS — F419 Anxiety disorder, unspecified: Secondary | ICD-10-CM

## 2023-04-19 MED ORDER — CARIPRAZINE HCL 3 MG PO CAPS: 3.0000 mg | ORAL_CAPSULE | Freq: Every day | ORAL | 1 refills | Status: DC

## 2023-05-27 ENCOUNTER — Telehealth (HOSPITAL_BASED_OUTPATIENT_CLINIC_OR_DEPARTMENT_OTHER): Payer: Commercial Managed Care - PPO | Admitting: Psychiatry

## 2023-05-27 ENCOUNTER — Encounter (HOSPITAL_COMMUNITY): Payer: Self-pay | Admitting: Psychiatry

## 2023-05-27 VITALS — Wt 175.0 lb

## 2023-05-27 DIAGNOSIS — F1210 Cannabis abuse, uncomplicated: Secondary | ICD-10-CM

## 2023-05-27 DIAGNOSIS — F419 Anxiety disorder, unspecified: Secondary | ICD-10-CM

## 2023-05-27 DIAGNOSIS — F200 Paranoid schizophrenia: Secondary | ICD-10-CM | POA: Diagnosis not present

## 2023-05-27 MED ORDER — CARIPRAZINE HCL 3 MG PO CAPS: 3.0000 mg | ORAL_CAPSULE | Freq: Every day | ORAL | 2 refills | Status: DC
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
## 2023-09-30 ENCOUNTER — Other Ambulatory Visit (HOSPITAL_COMMUNITY): Payer: Self-pay | Admitting: Psychiatry

## 2023-09-30 DIAGNOSIS — F419 Anxiety disorder, unspecified: Secondary | ICD-10-CM

## 2023-09-30 DIAGNOSIS — F200 Paranoid schizophrenia: Secondary | ICD-10-CM

## 2023-09-30 DIAGNOSIS — F1210 Cannabis abuse, uncomplicated: Secondary | ICD-10-CM

## 2023-10-02 ENCOUNTER — Encounter (HOSPITAL_COMMUNITY): Payer: Self-pay

## 2023-10-03 ENCOUNTER — Other Ambulatory Visit (HOSPITAL_COMMUNITY): Payer: Self-pay

## 2023-10-03 DIAGNOSIS — F419 Anxiety disorder, unspecified: Secondary | ICD-10-CM

## 2023-10-03 DIAGNOSIS — F1210 Cannabis abuse, uncomplicated: Secondary | ICD-10-CM

## 2023-10-03 DIAGNOSIS — F200 Paranoid schizophrenia: Secondary | ICD-10-CM

## 2023-10-03 MED ORDER — CARIPRAZINE HCL 3 MG PO CAPS: 3.0000 mg | ORAL_CAPSULE | Freq: Every day | ORAL | 0 refills | Status: DC

## 2023-10-12 ENCOUNTER — Telehealth (HOSPITAL_BASED_OUTPATIENT_CLINIC_OR_DEPARTMENT_OTHER): Payer: Commercial Managed Care - PPO | Admitting: Psychiatry

## 2023-10-12 ENCOUNTER — Encounter (HOSPITAL_COMMUNITY): Payer: Self-pay | Admitting: Psychiatry

## 2023-10-12 VITALS — Wt 181.0 lb

## 2023-10-12 DIAGNOSIS — F1210 Cannabis abuse, uncomplicated: Secondary | ICD-10-CM | POA: Diagnosis not present

## 2023-10-12 DIAGNOSIS — F419 Anxiety disorder, unspecified: Secondary | ICD-10-CM

## 2023-10-12 DIAGNOSIS — F200 Paranoid schizophrenia: Secondary | ICD-10-CM | POA: Diagnosis not present

## 2023-10-12 MED ORDER — CARIPRAZINE HCL 3 MG PO CAPS: 3.0000 mg | ORAL_CAPSULE | Freq: Every day | ORAL | 1 refills | Status: DC

## 2023-10-12 NOTE — Progress Notes (Signed)
 [facility] MD/PA/NP OP Progress Note  Virtual Visit via Video Note  I connected with Andre Stanton on 10/12/23 at 10:20 AM EDT by a video enabled telemedicine application and verified that I am speaking with the correct person using two identifiers.  Location: Patient: Home Provider: Home Office   I discussed the limitations of evaluation and management by telemedicine and the availability of in person appointments. The patient expressed understanding and agreed to proceed.   10/12/2023 9:32 AM Andre Stanton  MRN:  [id number]  Chief Complaint:  Chief Complaint  Patient presents with   Follow-up   Medication Refill   HPI: Patient is evaluated by video session.  He is taking Vraylar and reported his paranoia anxiety is under control.  He cut down his cannabis use.  He has not taken hydroxyzine in a while.  He reported things are going well and he continues to work almost 40 hours at [employer] and there is no major issues.  Patient told sleep is good.  Patient lives with his wife who also works and his father-in-law will diagnosed with mesothelioma is also lives with them.  Patient told his father-in-law clinically stable but has chronic issues.  Patient told he is going to [place] in May with his wife.  He is looking forward with that trip.  Denies any hallucination, paranoia, suicidal thoughts or homicidal thoughts.  He denies any major panic attack.  Recently had a visit with his endocrinologist at [facility].  His hemoglobin A1c 7.1.  He has insulin pump.  He like to continue his current dose of Vraylar which is keeping his mood stable.  Visit Diagnosis:    ICD-10-CM   1. Paranoid schizophrenia (HCC)  F20.0 cariprazine (VRAYLAR) 3 MG capsule    2. Anxiety  F41.9 cariprazine (VRAYLAR) 3 MG capsule    3. Mild tetrahydrocannabinol (THC) abuse  F12.10 cariprazine (VRAYLAR) 3 MG capsule      Past Psychiatric History: Reviewed H/O depression, paranoia, THC use and anxiety. Never seek treatment  before. Briefly saw therapist but did not helped.  H/O difficult childhood and suicidal thoughts but no attempt.  No h/o inpatient.    Past Medical History:  Past Medical History:  Diagnosis Date   Diabetes mellitus type 1 (HCC)    No past surgical history on file.  Family History:  Family History  Problem Relation Age of Onset   Seizures Maternal Grandmother     Social History:  Social History   Socioeconomic History   Marital status: Single    Spouse name: Not on file   Number of children: Not on file   Years of education: Not on file   Highest education level: Not on file  Occupational History   Not on file  Tobacco Use   Smoking status: Never   Smokeless tobacco: Never  Vaping Use   Vaping status: Never Used  Substance and Sexual Activity   Alcohol use: Yes    Comment: occasionally   Drug use: Yes    Types: Marijuana    Comment: daily use   Sexual activity: Not on file  Other Topics Concern   Not on file  Social History Narrative   Not on file   Social Drivers of Health   Financial Resource Strain: Not on file  Food Insecurity: Not on file  Transportation Needs: Not on file  Physical Activity: Not on file  Stress: Not on file  Social Connections: Not on file    Allergies: No  Known Allergies  Metabolic Disorder Labs: Lab Results  Component Value Date   HGBA1C 6.8 (H) 05/24/2022   MPG 226 09/05/2016   MPG 229 09/03/2016   No results found for: "PROLACTIN" Lab Results  Component Value Date   CHOL 195 05/24/2022   TRIG 56.0 05/24/2022   HDL 55.70 05/24/2022   CHOLHDL 3 05/24/2022   VLDL 11.2 05/24/2022   LDLCALC 128 (H) 05/24/2022   Lab Results  Component Value Date   TSH 0.90 05/24/2022   TSH 1.619 09/03/2017    Therapeutic Level Labs: No results found for: "LITHIUM" No results found for: "VALPROATE" No results found for: "CBMZ"  Current Medications: Current Outpatient Medications  Medication Sig Dispense Refill   cariprazine  (VRAYLAR) 3 MG capsule Take 1 capsule (3 mg total) by mouth daily. Bridge to pt appt 3/13 14 capsule 0   Continuous Blood Gluc Sensor (DEXCOM G6 SENSOR) MISC SMARTSIG:Topical Every 10 Days 3 each 3   Continuous Blood Gluc Transmit (DEXCOM G6 TRANSMITTER) MISC USE TO TEST BLOOD GLUCOSE. CHANGE EVERY 3 MONTHS 1 each 3   HUMALOG 100 UNIT/ML injection SMARTSIG:0-100 Unit(s) SUB-Q Daily     hydrOXYzine (VISTARIL) 25 MG capsule Take 1 capsule (25 mg total) by mouth at bedtime as needed for anxiety. (Patient not taking: Reported on 12/08/2022) 20 capsule 0   No current facility-administered medications for this visit.     Musculoskeletal: Strength & Muscle Tone: within normal limits Gait & Station: normal Patient leans: N/A  Psychiatric Specialty Exam: Review of Systems  Weight 181 lb (82.1 kg).There is no height or weight on file to calculate BMI.  General Appearance: Casual  Eye Contact:  Fair  Speech:  Normal Rate  Volume:  Normal  Mood:  Euthymic  Affect:  Congruent  Thought Process:  Goal Directed  Orientation:  Full (Time, Place, and Person)  Thought Content: WDL   Suicidal Thoughts:  No  Homicidal Thoughts:  No  Memory:  Immediate;   Good Recent;   Good Remote;   Fair  Judgement:  Intact  Insight:  Present  Psychomotor Activity:  Normal  Concentration:  Concentration: Good and Attention Span: Good  Recall:  Good  Fund of Knowledge: Good  Language: Good  Akathisia:  No  Handed:  Right  AIMS (if indicated): not done  Assets:  Communication Skills Desire for Improvement Housing Social Support Talents/Skills Transportation  ADL's:  Intact  Cognition: WNL  Sleep:  Good   Screenings: GAD-7    Flowsheet Row Office Visit from 05/24/2022 in [facility] Visit from 05/18/2022 in [facility] [employer]  Total GAD-7 Score 18 20      PHQ2-9    Flowsheet Row Office Visit from 07/12/2022 in  [facility] Office Visit from 05/24/2022 in [facility] Visit from 05/18/2022 in [facility] Visit from 09/02/2017 in [facility] Care at [facility] Visit from 01/20/2017 in Primary Care at [facility] Total Score 2 1 2  0 6  PHQ-9 Total Score 5 14 15  -- 27        Assessment and Plan: Patient is 28 year old man with history of type 1 diabetes and schizophrenia, anxiety and cannabis use taking his medication.  He reported symptoms are stable.  He does not want to change the medication.  Encourage to continue follow-up with his endocrinologist for the management of diabetes.  Patient currently not in therapy and is not interested at this time.  He had cut down his cannabis use.  Will continue Vraylar 3 mg daily.  He has no tremor or shakes or any EPS.  Follow-up in 6 months unless patient requested an earlier appointment.  Collaboration of Care: Collaboration of Care: Other provider involved in patient's care AEB notes are available in epic to review  Patient/Guardian was advised Release of Information must be obtained prior to any record release in order to collaborate their care with an outside provider. Patient/Guardian was advised if they have not already done so to contact the registration department to sign all necessary forms in order for [place] to release information regarding their care.   Consent: Patient/Guardian gives verbal consent for treatment and assignment of benefits for services provided during this visit. Patient/Guardian expressed understanding and agreed to proceed.    Follow Up Instructions:    I discussed the assessment and treatment plan with the patient. The patient was provided an opportunity to ask questions and all were answered. The patient agreed with the plan and demonstrated an understanding of the instructions.   The patient was advised  to call back or seek an in-person evaluation if the symptoms worsen or if the condition fails to improve as anticipated.  I provided 18 minutes of non-face-to-face time during this encounter.  Cleotis Nipper, MD 10/12/2023, 9:32 AM

## 2024-04-10 ENCOUNTER — Encounter (HOSPITAL_COMMUNITY): Payer: Self-pay

## 2024-04-10 ENCOUNTER — Telehealth (HOSPITAL_BASED_OUTPATIENT_CLINIC_OR_DEPARTMENT_OTHER): Payer: Self-pay | Admitting: Psychiatry

## 2024-04-10 DIAGNOSIS — Z91199 Patient's noncompliance with other medical treatment and regimen due to unspecified reason: Secondary | ICD-10-CM

## 2024-04-10 NOTE — Progress Notes (Signed)
 Patient is no show

## 2024-04-11 ENCOUNTER — Telehealth (HOSPITAL_BASED_OUTPATIENT_CLINIC_OR_DEPARTMENT_OTHER): Admitting: Psychiatry

## 2024-04-11 ENCOUNTER — Encounter (HOSPITAL_COMMUNITY): Payer: Self-pay | Admitting: Psychiatry

## 2024-04-11 VITALS — Wt 181.0 lb

## 2024-04-11 DIAGNOSIS — F419 Anxiety disorder, unspecified: Secondary | ICD-10-CM

## 2024-04-11 DIAGNOSIS — F1210 Cannabis abuse, uncomplicated: Secondary | ICD-10-CM

## 2024-04-11 DIAGNOSIS — F200 Paranoid schizophrenia: Secondary | ICD-10-CM

## 2024-04-11 MED ORDER — CARIPRAZINE HCL 3 MG PO CAPS: 3.0000 mg | ORAL_CAPSULE | Freq: Every day | ORAL | 1 refills | Status: AC

## 2024-04-11 NOTE — Progress Notes (Signed)
 [facility] MD Virtual Progress Note   Patient Location: Home Provider Location: Home Office  I connect with patient by video and verified that I am speaking with correct person by using two identifiers. I discussed the limitations of evaluation and management by telemedicine and the availability of in person appointments. I also discussed with the patient that there may be a patient responsible charge related to this service. The patient expressed understanding and agreed to proceed.  Andre Stanton [id number] 28 y.o.  04/11/2024 11:04 AM  History of Present Illness:  Patient is evaluated by video session.  He apologized appointment missing yesterday.  He reported things are going okay and is taking Vraylar  which is helping his paranoia, anxiety.  Recently had a blood work and his hemoglobin A1c 7.5.  He admitted not adherence to his diet plan and he had gained weight in the summer.  Patient told his father-in-law not doing very well and he had visited twice with his wife to see him in [place].  Patient works at [employer] 40 hours a week.  He reported job is going very well and there has been no major concern or issue.  Denies any hallucinations.  He sleeps good.  He is trying to cut down his cannabis use.  Patient is on insulin  pump.  Her plan is to bring down his sugar and compliant with his diet plan.  Denies any major panic attack.  He like to continue Vraylar .  Past Psychiatric History: H/O depression, paranoia, THC use and anxiety. Never seek treatment before. Briefly saw therapist but did not helped.  H/O difficult childhood and suicidal thoughts but no attempt.  No h/o inpatient.    Past Medical History:  Diagnosis Date   Diabetes mellitus type 1 (HCC)     Outpatient Encounter Medications as of 04/11/2024  Medication Sig   cariprazine  (VRAYLAR ) 3 MG capsule Take 1 capsule (3 mg total) by mouth daily.   Continuous Blood Gluc Sensor (DEXCOM G6 SENSOR) MISC SMARTSIG:Topical  Every 10 Days   Continuous Blood Gluc Transmit (DEXCOM G6 TRANSMITTER) MISC USE TO TEST BLOOD GLUCOSE. CHANGE EVERY 3 MONTHS   HUMALOG 100 UNIT/ML injection SMARTSIG:0-100 Unit(s) SUB-Q Daily   hydrOXYzine  (VISTARIL ) 25 MG capsule Take 1 capsule (25 mg total) by mouth at bedtime as needed for anxiety. (Patient not taking: Reported on 12/08/2022)   No facility-administered encounter medications on file as of 04/11/2024.    No results found for this or any previous visit (from the past 2160 hours).   Psychiatric Specialty Exam: Physical Exam  Review of Systems  Weight 181 lb (82.1 kg).There is no height or weight on file to calculate BMI.  General Appearance: Casual  Eye Contact:  Fair  Speech:  Slow  Volume:  Decreased  Mood:  Euthymic  Affect:  Appropriate  Thought Process:  Goal Directed  Orientation:  Full (Time, Place, and Person)  Thought Content:  Logical  Suicidal Thoughts:  No  Homicidal Thoughts:  No  Memory:  Immediate;   Good Recent;   Good Remote;   Good  Judgement:  Good  Insight:  Present  Psychomotor Activity:  Normal  Concentration:  Concentration: Fair and Attention Span: Good  Recall:  Good  Fund of Knowledge:  Good  Language:  Good  Akathisia:  No  Handed:  Right  AIMS (if indicated):     Assets:  Communication Skills Desire for Improvement Housing Social Support Talents/Skills Transportation  ADL's:  Intact  Cognition:  WNL  Sleep:  good       10/12/2023    9:41 AM 07/12/2022    1:33 PM 05/24/2022    3:04 PM 05/18/2022    2:26 PM 09/02/2017    5:06 PM  Depression screen PHQ 2/9  Decreased Interest 0 1 0 1 0  Down, Depressed, Hopeless 0 1 1 1  0  PHQ - 2 Score 0 2 1 2  0  Altered sleeping  1 3 3    Tired, decreased energy  1 1 2    Change in appetite  0 3 3   Feeling bad or failure about yourself   1 1 1    Trouble concentrating   2 1   Moving slowly or fidgety/restless  0 3 3   Suicidal thoughts  0 0 0   PHQ-9 Score  5 14 15    Difficult  doing work/chores  Somewhat difficult Not difficult at all Not difficult at all     Assessment/Plan: Paranoid schizophrenia (HCC) - Plan: cariprazine  (VRAYLAR ) 3 MG capsule  Anxiety - Plan: cariprazine  (VRAYLAR ) 3 MG capsule  Mild tetrahydrocannabinol (THC) abuse - Plan: cariprazine  (VRAYLAR ) 3 MG capsule   Patient is 28 year old man with history of type 1 diabetes and schizophrenia, anxiety and cannabis use .  Reviewed blood work results.  Hemoglobin A1c 7.5 but admitted not compliant with diet plan.  He does not want to change the Vraylar  which is keeping his paranoia and anxiety stable.  So far no major concern or side effects from the medication.  He is also trying to cut down his cannabis use.  Continue Vraylar  3 mg daily.  Recommend to call back if is any question or any concern.  Follow-up in 6 months.   Follow Up Instructions:     I discussed the assessment and treatment plan with the patient. The patient was provided an opportunity to ask questions and all were answered. The patient agreed with the plan and demonstrated an understanding of the instructions.   The patient was advised to call back or seek an in-person evaluation if the symptoms worsen or if the condition fails to improve as anticipated.    Collaboration of Care: Other provider involved in patient's care AEB notes are available in epic to review  Patient/Guardian was advised Release of Information must be obtained prior to any record release in order to collaborate their care with an outside provider. Patient/Guardian was advised if they have not already done so to contact the registration department to sign all necessary forms in order for us  to release information regarding their care.   Consent: Patient/Guardian gives verbal consent for treatment and assignment of benefits for services provided during this visit. Patient/Guardian expressed understanding and agreed to proceed.     Total encounter time 17 minutes  which includes face-to-face time, chart reviewed, care coordination, order entry and documentation during this encounter.   Note: This document was prepared by [employer] voice dictation technology and any errors that results from this process are unintentional.    Leni ONEIDA Client, MD 04/11/2024

## 2024-10-10 ENCOUNTER — Telehealth (HOSPITAL_COMMUNITY): Admitting: Psychiatry
